# Patient Record
Sex: Male | Born: 1940
Health system: Southern US, Community
[De-identification: ages and names within clinical notes are randomized; demographics above are authoritative.]

## PROBLEM LIST (undated history)

## (undated) DIAGNOSIS — J449 Chronic obstructive pulmonary disease, unspecified: Secondary | ICD-10-CM

## (undated) DIAGNOSIS — I714 Abdominal aortic aneurysm, without rupture, unspecified: Secondary | ICD-10-CM

## (undated) DIAGNOSIS — I10 Essential (primary) hypertension: Secondary | ICD-10-CM

## (undated) DIAGNOSIS — M519 Unspecified thoracic, thoracolumbar and lumbosacral intervertebral disc disorder: Secondary | ICD-10-CM

## (undated) DIAGNOSIS — IMO0002 Reserved for concepts with insufficient information to code with codable children: Secondary | ICD-10-CM

## (undated) HISTORY — DX: Chronic obstructive pulmonary disease, unspecified: J44.9

## (undated) HISTORY — DX: Abdominal aortic aneurysm, without rupture: I71.4

## (undated) HISTORY — PX: HERNIA REPAIR: SHX51

## (undated) HISTORY — DX: Essential (primary) hypertension: I10

## (undated) HISTORY — DX: Abdominal aortic aneurysm, without rupture, unspecified: I71.40

## (undated) HISTORY — DX: Unspecified thoracic, thoracolumbar and lumbosacral intervertebral disc disorder: M51.9

## (undated) HISTORY — PX: HEMORROIDECTOMY: SUR656

## (undated) HISTORY — DX: Reserved for concepts with insufficient information to code with codable children: IMO0002

---

## 2011-01-24 ENCOUNTER — Ambulatory Visit
Admission: RE | Admit: 2011-01-24 | Discharge: 2011-01-24 | Payer: Self-pay | Source: Home / Self Care | Attending: Vascular Surgery | Admitting: Vascular Surgery

## 2011-01-24 ENCOUNTER — Ambulatory Visit: Admit: 2011-01-24 | Payer: Self-pay | Admitting: Vascular Surgery

## 2011-01-25 NOTE — Consult Note (Signed)
NEW PATIENT CONSULTATION  JOSHIA, KITCHINGS DOB:  [birth year]                                       01/24/2011 ZOXWR#:[id number]  I saw the patient in the office today in consultation concerning a 6.7- cm infrarenal abdominal aortic aneurysm.  He was referred by Dr. Sherryll Burger. This is a pleasant 70 year old gentleman who had a long history of back pain.  This gradually became worse when he was having to help his wife more, who was debilitated.  Unfortunately, she died in 01/19/11.  As a workup for his back pain, he underwent an MRI which shows degenerative disk disease at L1-2, L2-3, and L3-4.  Apparently, incidental finding was suggestive of abdominal aneurysm and subsequent CT scan of the abdomen was obtained which showed a 6.7-cm infrarenal abdominal aortic aneurysm which ended at the bifurcation.  He was sent for vascular consultation.  Of note, he has had no significant abdominal pain.  Of note, there is no family history of aneurysmal disease that he is aware of.  PAST MEDICAL HISTORY:  Significant for emphysema.  He denies any history of diabetes, hypertension, history of previous myocardial infarction, history of hypercholesterolemia, history of congestive heart failure.  PREVIOUS SURGERY:  Hernia on the right side and hemorrhoids.  SOCIAL HISTORY:  He is recently widowed.  He has no children.  He is retired.  He smokes a pack per day of cigarettes and has been smoking for 50 years.  FAMILY HISTORY:  There is no history of premature cardiovascular disease and no history of aneurysmal disease that he is aware of.  MEDICATIONS:  Tylenol with codeine every 6 hours as needed for his back pain and also Flexeril 10 mg b.i.d.  REVIEW OF SYSTEMS:  GENERAL:  He had no recent weight loss, weight gain, or problems with his appetite. CARDIOVASCULAR:  He had no chest pain, chest pressure, palpitations, or arrhythmias.  He has had no history of stroke, TIAs, or  amaurosis fugax. He has had no history of DVT or phlebitis. GI:  He has had problems with swallowing in the past and occasional constipation.  He has had no history of reflux. MUSCULOSKELETAL:  He does have a history of arthritis. NEUROLOGIC, HEMATOLOGIC, GU, ENT, PSYCHIATRIC, INTEGUMENTARY:  Review of systems is unremarkable as documented on the medical history form in his chart. PULMONARY:  He does have a history of emphysema.  He has had no recent productive cough, bronchitis, asthma, or wheezing.  PHYSICAL EXAMINATION:  This is a pleasant 70 year old gentleman who appears stated age.  Blood pressure is 147/88, heart rate is 80, respiratory rate is 22.  HEENT:  Unremarkable.  Lungs:  Clear bilaterally to auscultation without rales, rhonchi, or wheezing. Cardiovascular:  I do not detect any carotid bruits.  He has a regular rate and rhythm.  He has palpable femoral, popliteal, and posterior tibial pulses bilaterally.  He has no significant lower extremity swelling.  Abdomen:  Soft and nontender.  His aneurysm is palpable and nontender.  He has normal pitched bowel sounds.  Musculoskeletal:  He has no major deformities or cyanosis.  Neurologic:  He has no focal weakness or paresthesias.  Skin:  There are no ulcers or rashes.  I have reviewed his MRI which shows degenerative disk disease at multiple levels.  I have also reviewed his plain x-rays.  Unfortunately, his CT scan is  not on the disk that came with him and I am unable to pull it up on the computer in our office.  I am hoping I can it pull up from the hospital system.  This was done in [place].  The CT scan report, however, shows an infrarenal abdominal aortic aneurysm with a maximum diameter to 6.7 cm and considerable intraluminal clot.  The aneurysm terminates at the aortic bifurcation.  The renal arteries are patent and the celiac and SMA are patent.  I have also reviewed the records from Dr. Margaretmary Eddy office and it  appears that he has also had injection therapy for his back which he did not mention.  Given the size of the aneurysm, I think the risk of rupture is approximately 10% per year.  For this reason, I would recommend elective repair.  I will like to proceed with an arteriogram to evaluate for possible endovascular repair and I will also try to retrieve his CT scan at [facility].  In addition, he will need preoperative cardiac evaluation and I will have him see Dr. Elease Hashimoto preoperatively.  Once his workup is complete, we can discuss open versus endovascular repair of his aneurysm and proceed with elective repair which he is anxious to do. I have also discussed the importance of tobacco cessation and he understands that continued tobacco use increases the risk of aneurysm expansion and rupture, and also increases his perioperative risk.    Di Kindle. Edilia Bo, M.D. Electronically Signed  CSD/MEDQ  D:  01/24/2011  T:  01/25/2011  Job:  [id number]  cc:   Kirstie Peri, MD Vesta Mixer, M.D.

## 2011-01-29 ENCOUNTER — Ambulatory Visit (HOSPITAL_COMMUNITY)
Admission: RE | Admit: 2011-01-29 | Discharge: 2011-01-30 | Payer: Self-pay | Source: Home / Self Care | Attending: Vascular Surgery | Admitting: Vascular Surgery

## 2011-01-29 LAB — POCT I-STAT, CHEM 8
Creatinine, Ser: 1.2 mg/dL (ref 0.4–1.5)
HCT: 51 % (ref 39.0–52.0)
Hemoglobin: 17.3 g/dL — ABNORMAL HIGH (ref 13.0–17.0)
Sodium: 139 mEq/L (ref 135–145)
TCO2: 29 mmol/L (ref 0–100)

## 2011-01-30 LAB — CBC
MCH: 30 pg (ref 26.0–34.0)
MCHC: 33 g/dL (ref 30.0–36.0)
Platelets: 176 10*3/uL (ref 150–400)
RBC: 5.16 MIL/uL (ref 4.22–5.81)

## 2011-01-30 LAB — BASIC METABOLIC PANEL
Calcium: 9.2 mg/dL (ref 8.4–10.5)
Chloride: 103 mEq/L (ref 96–112)
Creatinine, Ser: 1.11 mg/dL (ref 0.4–1.5)
GFR calc Af Amer: >60 mL/min (ref >60)
Sodium: 140 mEq/L (ref 135–145)

## 2011-02-05 ENCOUNTER — Ambulatory Visit (INDEPENDENT_AMBULATORY_CARE_PROVIDER_SITE_OTHER): Payer: Self-pay | Admitting: Cardiovascular Disease

## 2011-02-05 DIAGNOSIS — I714 Abdominal aortic aneurysm, without rupture: Secondary | ICD-10-CM

## 2011-02-05 DIAGNOSIS — Z01810 Encounter for preprocedural cardiovascular examination: Secondary | ICD-10-CM

## 2011-02-07 NOTE — Op Note (Signed)
NAME:  Bobby Haney, HARTY NO.:  [id number]  MEDICAL RECORD NO.:  [id number]          PATIENT TYPE:  OIB  LOCATION:  2013                         FACILITY:  [facility]  PHYSICIAN:  Di Kindle. Edilia Bo, M.D.DATE OF BIRTH:  1941/04/03  DATE OF PROCEDURE:  01/29/2011 DATE OF DISCHARGE:                              OPERATIVE REPORT   PREOPERATIVE DIAGNOSIS:  A 6.7-cm infrarenal abdominal aortic aneurysm.  POSTOPERATIVE DIAGNOSIS:  A 6.7-cm infrarenal abdominal aortic aneurysm.  PROCEDURE: 1. Ultrasound-guided access to the right common femoral artery. 2. Aortogram with bilateral iliac arteriogram and bilateral lower     extremity runoff. 3. Perclose of the right common femoral artery.  SURGEON:  Di Kindle. Edilia Bo, MD  ANESTHESIA:  Local with sedation.  TECHNIQUE:  The patient was taken to the PV lab, received a milligram of Versed and 50 mcg of fentanyl.  He received additional doses for his chronic low back pain.  The groin was prepped and draped in the usual sterile fashion.  After the skin was infiltrated with 1% lidocaine under ultrasound guidance, the right common femoral artery was cannulated and a guidewire introduced into the infrarenal aorta under fluoroscopic control.  A 5-French sheath was introduced over the wire.  A pigtail catheter was positioned at the L1 vertebral body and flush aortogram obtained.  The catheter was then advanced and a lateral projection was obtained.  Catheter was then positioned above the aortic bifurcation, and iliac oblique projections were obtained.  Next, bilateral lower extremity runoff films were obtained.  At the completion of the procedure, the 5-French sheath was exchanged for a Perclose device.  This was advanced until the wire was at the level of the skin.  The wire was then removed.  Catheter advanced until there was good return and then the foot deployed and the catheter retracted.  The suture was then engaged;  however, the suture did not catch, therefore the catheter foot was retracted and the catheter retracted so that a guidewire could be reintroduced and a new Perclose device was placed.  Again, the catheter was advanced, the wire removed, and then when there was good return from the marker port, the foot was deployed, the catheter retracted, and the needle engaged, and at this time it worked effectively.  The foot was then retracted, the catheter retracted, and the sutures tied using the knot pusher.  There was good hemostasis.  Pressure was held for 10 minutes.  FINDINGS:  There are single renal arteries bilaterally with no significant renal artery stenosis identified.  There was significant tortuosity of the neck of the aneurysm.  The size of the aneurysm cannot be determined by this study because of the laminated thrombus.  There was not frank aneurysmal disease of the common iliac arteries, although they were slightly ectatic.  Bilateral hypogastric arteries, external iliac arteries, common femoral, deep femoral, and superficial femoral arteries are patent.  Popliteal arteries are patent.  There was three- vessel runoff bilaterally via the anterior tibial, posterior tibial, and peroneal arteries, although peroneal arteries are somewhat small.     Di Kindle. Edilia Bo, M.D.  CSD/MEDQ  D:  01/29/2011  T:  01/29/2011  Job:  [id number]  cc:   Kirstie Peri, MD Vesta Mixer, M.D.  Electronically Signed by Waverly Ferrari M.D. on 02/07/2011 02:08:15 PM

## 2011-02-08 ENCOUNTER — Ambulatory Visit: Payer: Self-pay | Admitting: Vascular Surgery

## 2011-02-15 ENCOUNTER — Telehealth (INDEPENDENT_AMBULATORY_CARE_PROVIDER_SITE_OTHER): Payer: Self-pay | Admitting: Radiology

## 2011-02-19 ENCOUNTER — Encounter: Payer: Self-pay | Admitting: Internal Medicine

## 2011-02-19 ENCOUNTER — Ambulatory Visit (HOSPITAL_COMMUNITY): Payer: Medicare Other | Attending: Internal Medicine

## 2011-02-19 DIAGNOSIS — I714 Abdominal aortic aneurysm, without rupture, unspecified: Secondary | ICD-10-CM | POA: Insufficient documentation

## 2011-02-19 DIAGNOSIS — R0602 Shortness of breath: Secondary | ICD-10-CM

## 2011-02-21 NOTE — Progress Notes (Signed)
Summary: Nuclear Pre-Procedure  Phone Note Outgoing Call Call back at [facility] Phone [phone]   Call placed by: Stanton Kidney, EMT-P,  February 15, 2011 1:01 PM Call placed to: Patient Action Taken: Phone Call Completed Reason for Call: Confirm/change Appt Summary of Call: Reviewed information on Myoview Information Sheet (see scanned document for further details).  Spoke with the patient. Stanton Kidney, EMT-P  February 15, 2011 1:01 PM      Nuclear Med Background Indications for Stress Test: Evaluation for Ischemia, Surgical Clearance  Indications Comments: AAA (6.7) Dr Edilia Bo  History: COPD

## 2011-02-27 NOTE — Assessment & Plan Note (Signed)
Summary: Cardiology Nuclear Testing  Nuclear Med Background Indications for Stress Test: Evaluation for Ischemia, Surgical Clearance  Indications Comments: Pending AAA repair by Dr. Waverly Ferrari  History: COPD, Emphysema  History Comments: No documented CAD.  Symptoms: DOE, Fatigue    Nuclear Pre-Procedure Cardiac Risk Factors: PVD Caffeine/Decaff Intake: none NPO After: none Lungs: Clear.  O2 Sat 98% on RA. IV 0.9% NS with Angio Cath: 20g     IV Site: R Antecubital IV Started by: Stanton Kidney, EMT-P Chest Size (in) 36     Height (in): 71 Weight (lb): 147 BMI: 20.58 Tech Comments: AAA 6.7 cm  Nuclear Med Study 1 or 2 day study:  1 day     Stress Test Type:  Eugenie Birks Reading MD:  Dietrich Pates, MD     Referring MD:  Kristeen Miss, MD Resting Radionuclide:  Technetium 20m Tetrofosmin     Resting Radionuclide Dose:  10.1 mCi  Stress Radionuclide:  Technetium 44m Tetrofosmin     Stress Radionuclide Dose:  33 mCi   Stress Protocol   Lexiscan: 0.4 mg   Stress Test Technologist:  [employer], CMA-N     Nuclear Technologist:  Domenic Polite, CNMT  Rest Procedure  Myocardial perfusion imaging was performed at rest 45 minutes following the intravenous administration of Technetium 15m Tetrofosmin.  Stress Procedure  The patient received IV Lexiscan 0.4 mg over 15-seconds.  Technetium 51m Tetrofosmin injected at 30-seconds.  There were no significant changes with infusion.  Quantitative spect images were obtained after a 45 minute delay.  QPS Raw Data Images:  Soft tissue (diaplhragm, bowel) underlieheart.  IMages were motion corrected. Stress Images:  Normal homogeneous uptake in all areas of the myocardium. Rest Images:  Normal homogeneous uptake in all areas of the myocardium. Subtraction (SDS):  No evidence of ischemia. Transient Ischemic Dilatation:  1.10  (Normal <1.22)  Lung/Heart Ratio:  .26  (Normal <0.45)  Quantitative Gated Spect Images QGS EDV:  66 ml QGS  ESV:  18 ml QGS EF:  72 %   Overall Impression  Exercise Capacity: Lexiscan with no exercise. BP Response: Normal blood pressure response. Clinical Symptoms: No chest pain ECG Impression: No significant ST segment change suggestive of ischemia. Overall Impression: Normal stress nuclear study.

## 2011-03-01 ENCOUNTER — Ambulatory Visit: Payer: Medicare Other | Admitting: Vascular Surgery

## 2011-03-07 ENCOUNTER — Ambulatory Visit (INDEPENDENT_AMBULATORY_CARE_PROVIDER_SITE_OTHER): Payer: Medicare Other | Admitting: Vascular Surgery

## 2011-03-07 DIAGNOSIS — I714 Abdominal aortic aneurysm, without rupture: Secondary | ICD-10-CM

## 2011-03-08 NOTE — H&P (Signed)
HISTORY AND PHYSICAL EXAMINATION  March 07, 2011  Re:  Bobby Haney, Bobby Haney                   DOB:  10-09-1941  REASON FOR ADMISSION:  A  6.7 cm infrarenal abdominal aortic aneurysm.  HISTORY:  This is a pleasant, 70 year old gentleman who I originally saw in consultation on 01/24/2011 with a 6.7 cm infrarenal abdominal aortic aneurysm.  He is referred by Dr. Sherryll Burger.  He had a long history of back pain.  This had been getting worse.  He underwent an MRI to workup his back pain and has significant degenerative  disk disease L1-L2, L2-L3, L3-L4.  An incidental finding was this large abdominal aortic aneurysm which ends at the bifurcation.  I saw him in consultation and we have arranged his workup.  I do not think his back pain is related to his aneurysm.  He has had no significant abdominal pain.  PAST MEDICAL HISTORY:  Significant for COPD with continued tobacco use. He denies any history of diabetes, hypertension, history of previous myocardial infarction, history of congestive heart failure or history of hypercholesterolemia.  PAST SURGICAL HISTORY:  He has had a hernia on the right side.  SOCIAL HISTORY:  He is widowed.  He recently lost his wife in January of 2012.  She had chronic kidney disease and multiple other medical issues. He has no children. He is retired.  He smokes a pack per day of cigarettes and has been  smoking for 50 years.  Recently he has tried to cut back to about a half a pack per day.  FAMILY HISTORY:  There is no history of premature cardiovascular disease and history of aneurysmal disease that he is aware of.  MEDICATIONS: 1. Tylenol with codeine  q.6 h. p.r.n. pain. 2. Flexeril 10 mg p.o. b.i.d. 3. Oxycodone for pain p.r.n.  ALLERGIES:  No known drug allergies.  REVIEW OF SYSTEMS:  GENERAL:  He has had no recent weight loss, weight gain or problems with his appetite. CARDIOVASCULAR:  He had no chest pain, chest pressure, palpitations  or arrhythmias.  He has ad no history of stroke, TIAs or amaurosis fugax. He had no history of DVT or phlebitis. GI:  He has had problems with swallowing in the past and occasional constipation.  He has had no history of reflux. MUSCULOSKELETAL:  He does have a history of arthritis. Neurologic, hematologic, GU, ENT, psychiatric, integumentary review of systems is unremarkable and is documented on the medical history form in his chart. PULMONARY:  He does have a history of emphysema.  He had no recent productive cough, bronchitis, asthma or wheezing.  PHYSICAL EXAMINATION:  This is a pleasant 70 year old gentleman who appears his stated age.  Temperature is 98.0, blood pressure 135/86, heart rate is 82.  HEENT:  Unremarkable.  Lungs:  Clear bilaterally to auscultation without rales, rhonchi or wheezing.  Cardiovascular:  I do not detect any carotid bruits.  He has a regular rate and rhythm.  He has palpable femoral, popliteal, posterior tibial pulses bilaterally. He has no significant lower extremity swelling.  Abdomen:  Soft, nontender.  His aneurysm is palpable and nontender.  He has normal pitched bowel sounds.  Musculoskeletal exam:  There are no major deformities or cyanosis.  Neurologic exam;  He has no focal weakness or paresthesias.  Skin:  There are no ulcers or rashes.  His MRI shows degenerative disk disease at multiple levels.  His CT scan shows an infrarenal abdominal  aortic aneurysm with a maximum diameter of 6.7 cm and considerable intraluminal clot.  The aneurysm terminates at the aortic bifurcation.  Renal arteries are patent.  I have performed an arteriogram  on him which shows significant tortuosity at the proximal neck which  makes him not a candidate for endovascular repair.  I think there is simply too much angulation to get a proximal seal.  He has undergone preoperative cardiac workup by Dr. Kristeen Miss and [phone] has been cleared for surgery.  He had a  Cardiolite study which showed normal stress nuclear test;  no significant ST-segment changes suggestive of ischemia.  I have reviewed the indications for open repair of his aneurysm.  He understands the risk for rupture without repair is probably 15% per year with an aneurysm of this size.  We have discussed the potential complications of surgery including but not limited to bleeding, wound healing problems, bleeding, MI, renal failure and other unpredictable medical problems.  All of his questions were answered and he is agreeable to proceed.  His surgery is scheduled for 03/20/2011.    Di Kindle. Edilia Bo, M.D. Electronically Signed  CSD/MEDQ  D:  03/07/2011  T:  03/08/2011  Job:  [id number]  cc:   Vesta Mixer, M.D. Kirstie Peri, MD

## 2011-03-16 ENCOUNTER — Other Ambulatory Visit: Payer: Self-pay | Admitting: Vascular Surgery

## 2011-03-16 ENCOUNTER — Encounter: Payer: Self-pay | Admitting: Cardiovascular Disease

## 2011-03-16 ENCOUNTER — Ambulatory Visit (INDEPENDENT_AMBULATORY_CARE_PROVIDER_SITE_OTHER): Payer: Medicare Other | Admitting: Cardiovascular Disease

## 2011-03-16 ENCOUNTER — Ambulatory Visit (HOSPITAL_COMMUNITY)
Admission: RE | Admit: 2011-03-16 | Discharge: 2011-03-16 | Disposition: A | Discharge status: Home / Self Care | Payer: Medicare Other | Source: Ambulatory Visit | Attending: Vascular Surgery | Admitting: Vascular Surgery

## 2011-03-16 ENCOUNTER — Encounter (HOSPITAL_COMMUNITY)
Admission: RE | Admit: 2011-03-16 | Discharge: 2011-03-16 | Disposition: A | Discharge status: Home / Self Care | Payer: Medicare Other | Source: Ambulatory Visit | Attending: Vascular Surgery | Admitting: Vascular Surgery

## 2011-03-16 DIAGNOSIS — I999 Unspecified disorder of circulatory system: Secondary | ICD-10-CM

## 2011-03-16 DIAGNOSIS — I714 Abdominal aortic aneurysm, without rupture, unspecified: Secondary | ICD-10-CM | POA: Insufficient documentation

## 2011-03-16 DIAGNOSIS — Z01818 Encounter for other preprocedural examination: Secondary | ICD-10-CM | POA: Insufficient documentation

## 2011-03-16 LAB — CBC
HCT: 43.9 % (ref 39.0–52.0)
Hemoglobin: 15.5 g/dL (ref 13.0–17.0)
MCH: 31 pg (ref 26.0–34.0)
MCV: 87.8 fL (ref 78.0–100.0)
RBC: 5 MIL/uL (ref 4.22–5.81)
WBC: 10.3 10*3/uL (ref 4.0–10.5)

## 2011-03-16 LAB — COMPREHENSIVE METABOLIC PANEL
AST: 19 U/L (ref 0–37)
Albumin: 3.8 g/dL (ref 3.5–5.2)
Alkaline Phosphatase: 63 U/L (ref 39–117)
BUN: 7 mg/dL (ref 6–23)
Chloride: 113 mEq/L — ABNORMAL HIGH (ref 96–112)
GFR calc Af Amer: >60 mL/min (ref >60)
Potassium: 4.5 mEq/L (ref 3.5–5.1)
Total Bilirubin: 0.5 mg/dL (ref 0.3–1.2)
Total Protein: 6.4 g/dL (ref 6.0–8.3)

## 2011-03-16 LAB — BLOOD GAS, ARTERIAL
Drawn by: 181601
FIO2: 0.21 %
pCO2 arterial: 33.4 mmHg — ABNORMAL LOW (ref 35.0–45.0)
pH, Arterial: 7.442 (ref 7.350–7.450)

## 2011-03-16 LAB — URINALYSIS, ROUTINE W REFLEX MICROSCOPIC
Bilirubin Urine: NEGATIVE
Glucose, UA: NEGATIVE mg/dL
Hgb urine dipstick: NEGATIVE
Ketones, ur: NEGATIVE mg/dL
Specific Gravity, Urine: 1.019 (ref 1.005–1.030)
pH: 5 (ref 5.0–8.0)

## 2011-03-16 LAB — APTT: aPTT: 26 seconds (ref 24–37)

## 2011-03-16 LAB — SURGICAL PCR SCREEN
MRSA, PCR: NEGATIVE
Staphylococcus aureus: NEGATIVE

## 2011-03-16 LAB — ABO/RH: ABO/RH(D): O POS

## 2011-03-16 LAB — PROTIME-INR: INR: 0.87 (ref 0.00–1.49)

## 2011-03-16 NOTE — Progress Notes (Signed)
Subjective:   Current Outpatient Prescriptions  Medication Sig Dispense Refill  . HYDROcodone-acetaminophen (VICODIN) 5-500 MG per tablet Take 1 tablet by mouth every 6 (six) hours as needed.        . senna (SENOKOT) 8.6 MG tablet Take 1 tablet by mouth 2 (two) times daily as needed.          Allergies  Allergen Reactions  . Influenza A     Patient Active Problem List  Diagnoses  . AAA (abdominal aortic aneurysm)    History  Smoking status  . Current Everyday Smoker -- 2.0 packs/day  . Types: Cigarettes  Smokeless tobacco  . Not on file    History  Alcohol Use No    Family History  Problem Relation Age of Onset  . Heart attack Neg Hx     Review of Systems: The patient denies any heat or cold intolerance.  No weight gain or weight loss.  The patient denies headaches or blurry vision.  There is no cough or sputum production.  The patient denies dizziness.  There is no hematuria or hematochezia.  The patient denies any muscle aches or arthritis.  The patient denies any rash.  The patient denies frequent falling or instability.  There is no history of depression or anxiety.  All other systems were reviewed and are negative.  Physical Exam:  The patient is alert and oriented x 3.  The mood and affect are normal. The HEENT exam reveals that the sclera are nonicteric.  The mucous membranes are moist.  The carotids are 2+ without bruits.  There is no thyromegaly.  There is no JVD.  The lungs are clear.  The chest wall is non tender.  The heart exam reveals a regular rate with a normal S1 and S2.  There are no murmurs, gallops, or rubs.  The PMI is not displaced.   Abdominal exam reveals good bowel sounds.  There is no guarding or rebound.  Has a palpablePulsatile midline mass.  Exam of the legs reveal no clubbing, cyanosis, or edema.  The legs are without rashes.  The distal pulses are intact.  Cranial nerves II - XII are intact.  Motor and sensory functions are intact.  The gait is  normal.  Assessment / Plan:

## 2011-03-16 NOTE — Progress Notes (Deleted)
Subjective:   No current outpatient prescriptions on file prior to visit.    Allergies  Allergen Reactions  . Influenza A     Patient Active Problem List  Diagnoses  . AAA (abdominal aortic aneurysm)    History  Smoking status  . Current Everyday Smoker -- 2.0 packs/day  . Types: Cigarettes  Smokeless tobacco  . Not on file    History  Alcohol Use No    Family History  Problem Relation Age of Onset  . Heart attack Neg Hx     Review of Systems: The patient denies any heat or cold intolerance.  No weight gain or weight loss.  The patient denies headaches or blurry vision.  There is no cough or sputum production.  The patient denies dizziness.  There is no hematuria or hematochezia.  The patient denies any muscle aches or arthritis.  The patient denies any rash.  The patient denies frequent falling or instability.  There is no history of depression or anxiety.  All other systems were reviewed and are negative.   Physical Exam:  Assessment / Plan:

## 2011-03-16 NOTE — Assessment & Plan Note (Signed)
The patient presents as a preoperative visit for his abdominal aortic aneurysm. He's had a normal Lexiscan Myoview study. He has a long history of cigarette smoking and Is at some increased risk for cardiovascular complications. He was left ventricular systolic function is normal so I would only put him at mildly to moderately increased risk.  We will follow him in the hospital. I'll see him in 2 months.

## 2011-03-20 ENCOUNTER — Other Ambulatory Visit: Payer: Self-pay | Admitting: Vascular Surgery

## 2011-03-20 ENCOUNTER — Inpatient Hospital Stay (HOSPITAL_COMMUNITY)
Admission: RE | Admit: 2011-03-20 | Discharge: 2011-03-25 | DRG: 238 | Disposition: A | Discharge status: Home / Self Care | Payer: Medicare Other | Source: Ambulatory Visit | Attending: Vascular Surgery | Admitting: Vascular Surgery

## 2011-03-20 ENCOUNTER — Inpatient Hospital Stay (HOSPITAL_COMMUNITY): Payer: Medicare Other

## 2011-03-20 DIAGNOSIS — I714 Abdominal aortic aneurysm, without rupture, unspecified: Principal | ICD-10-CM | POA: Diagnosis present

## 2011-03-20 DIAGNOSIS — J4489 Other specified chronic obstructive pulmonary disease: Secondary | ICD-10-CM | POA: Diagnosis present

## 2011-03-20 DIAGNOSIS — F17200 Nicotine dependence, unspecified, uncomplicated: Secondary | ICD-10-CM | POA: Diagnosis present

## 2011-03-20 DIAGNOSIS — J449 Chronic obstructive pulmonary disease, unspecified: Secondary | ICD-10-CM | POA: Diagnosis present

## 2011-03-20 HISTORY — PX: ABDOMINAL AORTIC ANEURYSM REPAIR: SUR1152

## 2011-03-20 LAB — PROTIME-INR
INR: 1.08 (ref 0.00–1.49)
Prothrombin Time: 14.2 seconds (ref 11.6–15.2)

## 2011-03-20 LAB — BASIC METABOLIC PANEL
Calcium: 7.7 mg/dL — ABNORMAL LOW (ref 8.4–10.5)
Creatinine, Ser: 0.84 mg/dL (ref 0.4–1.5)
GFR calc Af Amer: >60 mL/min (ref >60)
GFR calc non Af Amer: >60 mL/min (ref >60)
Sodium: 140 mEq/L (ref 135–145)

## 2011-03-20 LAB — CBC
MCH: 29.8 pg (ref 26.0–34.0)
MCHC: 33.2 g/dL (ref 30.0–36.0)
Platelets: 169 10*3/uL (ref 150–400)
RDW: 14.2 % (ref 11.5–15.5)

## 2011-03-20 LAB — MAGNESIUM: Magnesium: 1.6 mg/dL (ref 1.5–2.5)

## 2011-03-21 ENCOUNTER — Inpatient Hospital Stay (HOSPITAL_COMMUNITY): Payer: Medicare Other

## 2011-03-21 LAB — CBC
MCH: 30.6 pg (ref 26.0–34.0)
MCHC: 33.8 g/dL (ref 30.0–36.0)
MCV: 90.3 fL (ref 78.0–100.0)
Platelets: 156 10*3/uL (ref 150–400)
RBC: 4.45 MIL/uL (ref 4.22–5.81)
RDW: 14.2 % (ref 11.5–15.5)

## 2011-03-21 LAB — TYPE AND SCREEN
Antibody Screen: NEGATIVE
Unit division: 0

## 2011-03-21 LAB — COMPREHENSIVE METABOLIC PANEL
Alkaline Phosphatase: 45 U/L (ref 39–117)
BUN: 6 mg/dL (ref 6–23)
Creatinine, Ser: 0.72 mg/dL (ref 0.4–1.5)
Glucose, Bld: 134 mg/dL — ABNORMAL HIGH (ref 70–99)
Potassium: 4.1 mEq/L (ref 3.5–5.1)
Total Protein: 4.5 g/dL — ABNORMAL LOW (ref 6.0–8.3)

## 2011-03-21 NOTE — Op Note (Signed)
NAME:  Bobby Haney, Bobby Haney NO.:  [id number]  MEDICAL RECORD NO.:  [id number]           PATIENT TYPE:  LOCATION:                                 FACILITY:  PHYSICIAN:  Di Kindle. Edilia Bo, M.D.DATE OF BIRTH:  [birth year]  DATE OF PROCEDURE: DATE OF DISCHARGE:                              OPERATIVE REPORT   PREOPERATIVE DIAGNOSIS:  A 6.7 cm infrarenal abdominal aortic aneurysm.  POSTOPERATIVE DIAGNOSIS:  A 6.7 cm infrarenal abdominal aortic aneurysm.  PROCEDURE:  Repair of infrarenal abdominal aortic aneurysm with aortobi- iliac bypass graft.  SURGEON:  Di Kindle. Edilia Bo, M.D.  ASSISTANT:  Della Goo, PA-C.  ANESTHESIA:  General.  INDICATIONS:  This is a 70 year old gentleman who I had seen in consultation in January with a 6.7-cm infrarenal abdominal aortic aneurysm, which was found on an MRI to work up back pain.  Given the size of the aneurysm and risk for rupture, an elective repair was recommended.  He had significant tortuosity of the neck of the aneurysm and therefore was not felt to be a good candidate for endovascular repair of his aneurysm.  He was brought in for elective open repair.  He underwent preoperative cardiac clearance by Dr. Elease Hashimoto.  TECHNIQUE:  The patient was taken to the operating room after Swan-Ganz catheter and arterial line were placed by Anesthesia.  The abdomen, groins, and thighs were prepped and draped in the usual sterile fashion. The abdomen was entered through a midline incision.  Upon careful exploration, no other intra-abdominal pathology was noted except for the large infrarenal aneurysm.  The transverse colon was reflected superiorly.  The small bowel reflected to the right.  The retroperitoneal tissue was divided allowing exposure of the neck of the aneurysm up to the level of the renal vein, which was mobilized and controlled with a blue vessel loop.  I then dissected out the neck of the aneurysm and this  was controlled with an umbilical tape.  The IMA was controlled with vessel loop.  Dissection was carried down to where the right common iliac artery was controlled with a blue loop.  I then preserved the sympathetic nerves over the aortic bifurcation and dissected the left common iliac artery lateral to this.  The patient was then heparinized.  The infrarenal aortic clamp was then placed below the level of the renal arteries and then the common iliac arteries were clamped.  The aneurysm was opened and teed off proximally and distally. Lumbars were oversewn with 2-0 silk ties.  Once hemostasis was obtained, the proximal aorta was divided circumferentially and proximally.  The 14 x 8 graft was selected.  This was cut to the appropriate length and then using a felt cuff the proximal anastomosis was done end-to-end with continuous 3-0 Prolene suture.  The proximal anastomosis was tested and was hemostatic, next attention was turned to the right iliac anastomosis.  The right common iliac artery was divided circumferentially.  The right limb of the graft cut to the appropriate length, spatulated, and sewn end-to-end to the right common iliac artery using 5-0 Prolene suture.  Prior to completing the anastomosis,  the arteries were back bled and flushed appropriately and anastomosis completed.  Flow was reestablished to the right leg.  The patient tolerated this from a hemodynamic standpoint.  Next,  attention was turned to the left common iliac anastomosis.  The left limb of the graft was cut to the appropriate length, spatulated, and sewn end-to-side to the divided left common iliac artery using continuous 5-0 Prolene suture.  Again, prior to completing this anastomosis the arteries were back bled and flushed appropriately and then the anastomosis completed and flow was reestablished to the left leg, which the patient tolerated from a hemodynamic standpoint.  There had been good backbleeding  from the IMA and this was ligated with 2-0 silk ties.  Of note, the left limb of the graft lay on top of the sympathetic fibers as I thought tunneling beneath the sympathetic fibers with resulting kinking of the graft. Next, the heparin was partially reversed with protamine.  The abdominal contents were returned to the normal position after the abdomen was irrigated.  Hemostasis had been obtained.  The fascial layer was closed with two #1 PDS sutures.  The subcutaneous tissue over the umbilicus was closed with 3-0 Vicryl and then the skin was closed with two 4-0 subcuticular stitch.  Sterile dressing was applied.  The patient tolerated the procedure well, was transferred to the recovery room in stable condition.  All needle and sponge counts were correct.     Di Kindle. Edilia Bo, M.D.     CSD/MEDQ  D:  03/20/2011  T:  03/20/2011  Job:  [id number]  cc:   Kirstie Peri, MD Vesta Mixer, M.D.  Electronically Signed by Waverly Ferrari M.D. on 03/21/2011 11:17:11 AM

## 2011-03-22 LAB — CBC
MCH: 30.5 pg (ref 26.0–34.0)
MCHC: 33.5 g/dL (ref 30.0–36.0)
Platelets: 148 10*3/uL — ABNORMAL LOW (ref 150–400)

## 2011-03-22 LAB — BASIC METABOLIC PANEL
CO2: 31 mEq/L (ref 19–32)
Calcium: 8.4 mg/dL (ref 8.4–10.5)
Creatinine, Ser: 0.79 mg/dL (ref 0.4–1.5)
GFR calc Af Amer: >60 mL/min (ref >60)

## 2011-03-23 LAB — GLUCOSE, CAPILLARY: Glucose-Capillary: 87 mg/dL (ref 70–99)

## 2011-03-24 LAB — BASIC METABOLIC PANEL
BUN: 7 mg/dL (ref 6–23)
CO2: 28 mEq/L (ref 19–32)
Calcium: 8.6 mg/dL (ref 8.4–10.5)
Creatinine, Ser: 0.69 mg/dL (ref 0.4–1.5)
GFR calc Af Amer: >60 mL/min (ref >60)

## 2011-03-24 LAB — CBC
Hemoglobin: 11.9 g/dL — ABNORMAL LOW (ref 13.0–17.0)
MCH: 30.1 pg (ref 26.0–34.0)
MCHC: 33.5 g/dL (ref 30.0–36.0)

## 2011-03-25 IMAGING — CR DG CHEST 1V PORT
1 series · 1 of 1 positions shown · non-contrast
Comparison: 03/16/2011.

CLINICAL DATA: History of post abdominal aortic aneurysm repair and
grafting.

PORTABLE CHEST - 1 VIEW

[view not recorded]
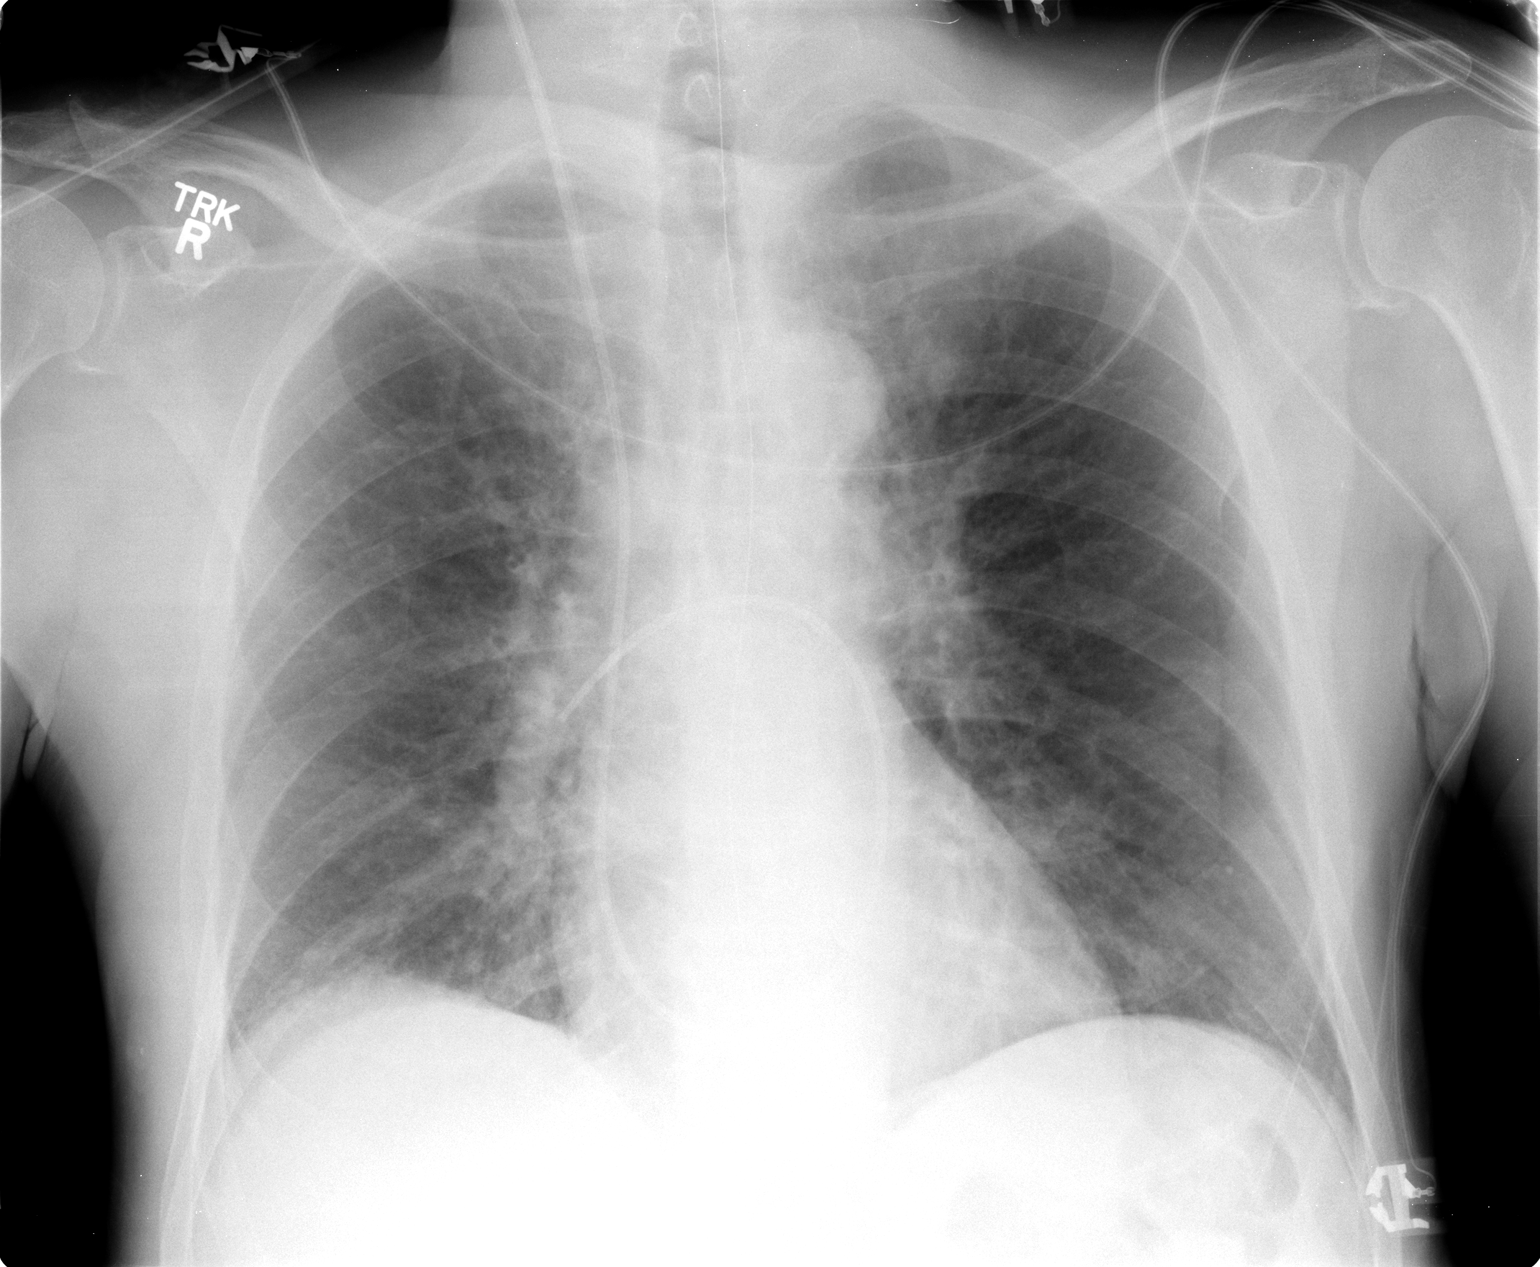

[1 of 1 positions shown; findings below may reference images not displayed]

FINDINGS: Enteric tube extends into the area of the stomach.  Tip
is not included on the image.  Right internal jugular Swan-Ganz
catheter is in place with tip in the area of descending right
pulmonary artery.  No pneumothorax is seen.  Heart is upper normal
size.  Lungs are free of infiltrates.  No pleural effusion is
evident.  Nonaneurysmal aortic calcification is present.
IMPRESSION: Enteric tube in place.  Tip of Swan Ganz catheter is in descending
right  pulmonary artery.  No pneumothorax.  No pulmonary edema,
pneumonia, or pleural effusion.

## 2011-03-26 IMAGING — CR DG CHEST 1V PORT
1 series · 1 of 1 positions shown · non-contrast
Comparison: 03/20/2011

CLINICAL DATA: Post aneurysm repair

PORTABLE CHEST - 1 VIEW

[view not recorded]
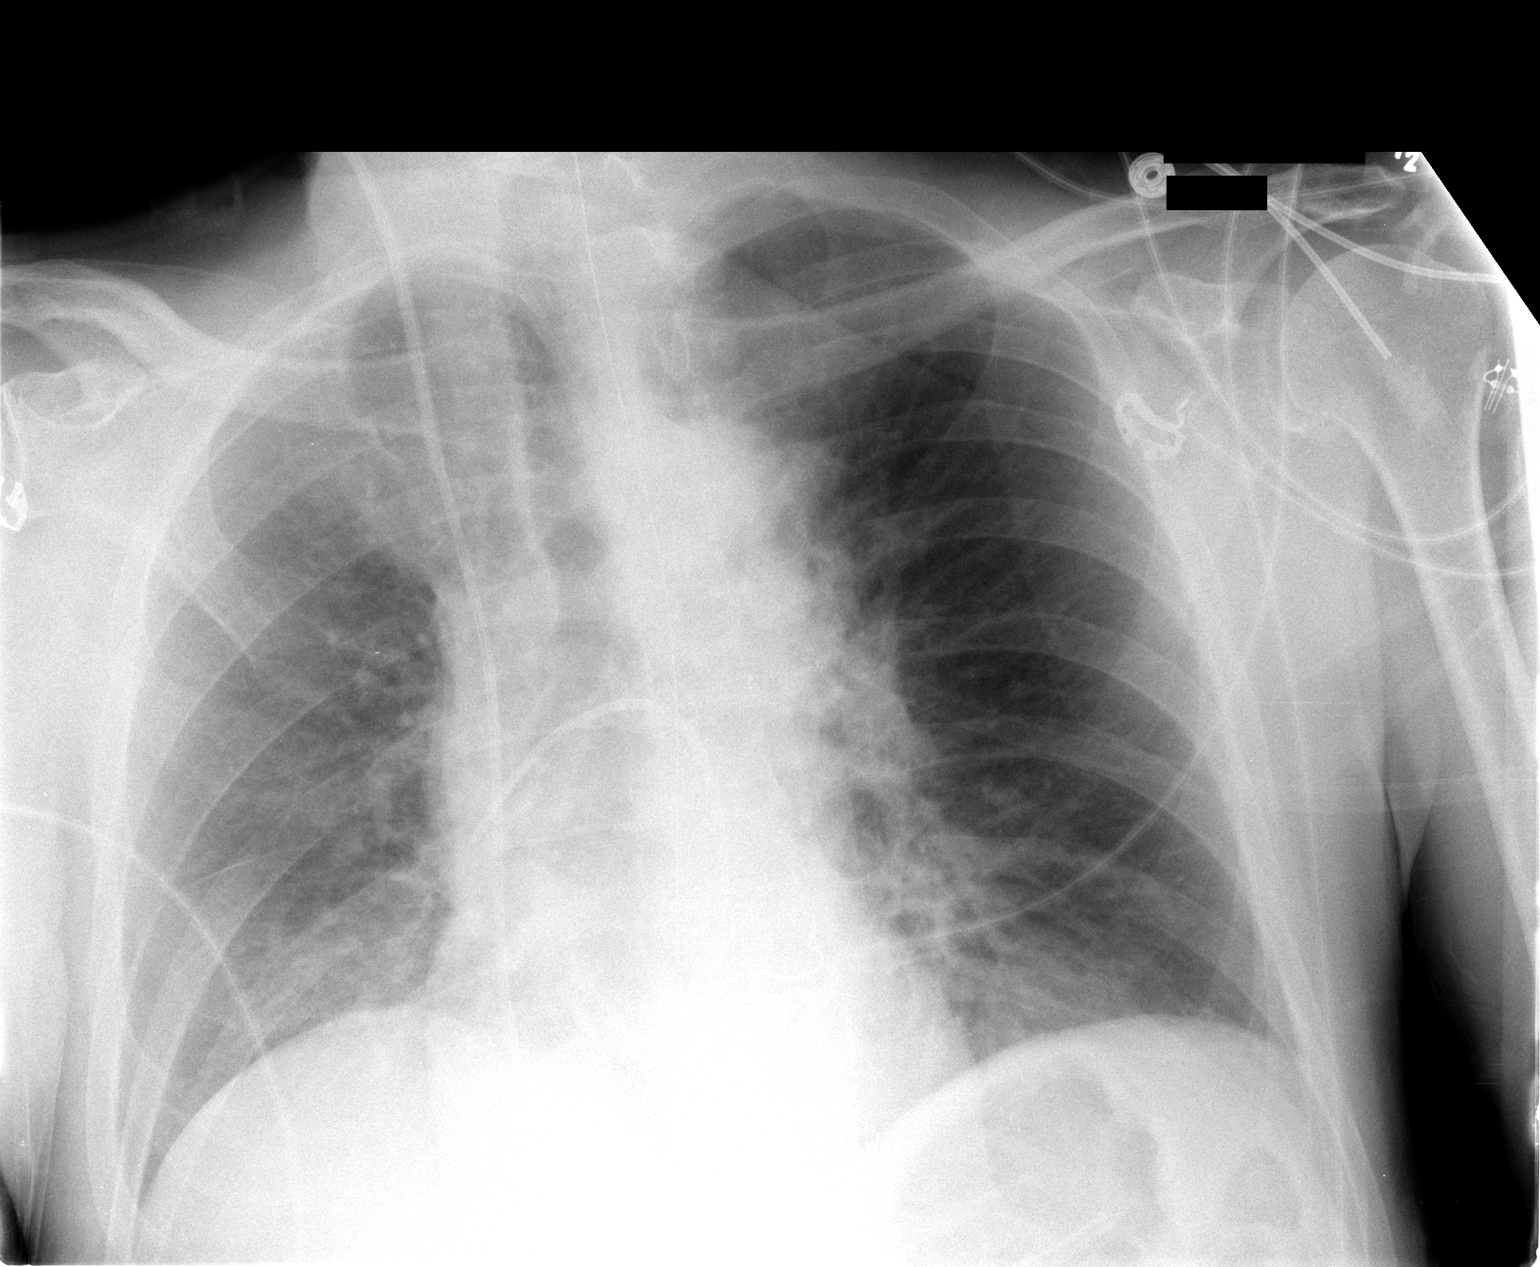

[1 of 1 positions shown; findings below may reference images not displayed]

FINDINGS: The patient is rotated to the right on today's exam.
There is minimal atelectasis at the lung bases.  Swan-Ganz catheter
and nasogastric tube unchanged.  No pneumothorax or heart failure.
IMPRESSION: Mild postoperative bibasilar atelectasis.

## 2011-03-27 NOTE — Discharge Summary (Addendum)
  NAME:  Bobby Haney, Bobby Haney NO.:  [id number]  MEDICAL RECORD NO.:  [id number]           PATIENT TYPE:  I  LOCATION:  2032                         FACILITY:  [facility]  PHYSICIAN:  Di Kindle. Edilia Bo, M.D.DATE OF BIRTH:  1941-10-24  DATE OF ADMISSION:  03/20/2011 DATE OF DISCHARGE:                              DISCHARGE SUMMARY   CHIEF COMPLAINT:  A 6.7-cm infrarenal abdominal aortic aneurysm.  HISTORY OF PRESENT ILLNESS:  Bobby Haney is a pleasant 71 year old gentleman who was seen in consultation in January 2012 with a 6.7 infrarenal abdominal aortic aneurysm which was found when the patient underwent an MRI for lumbar disk disease.  It was not felt that the back pain was related to his aneurysm and he has no significant abdominal pain.  He was admitted for open resection of abdominal aortic aneurysm as he had a long tortuous neck and a spherical aneurysm.  PAST MEDICAL HISTORY:  COPD and continued tobacco use.  He denies diabetes, hypertension, previous myocardial infarction, congestive heart failure, or hypercholesterolemia.  HOSPITAL COURSE:  The patient was taken to the operating room on March 20 for an open repair of infrarenal abdominal aortic aneurysm with aortobi-iliac bypass grafting.  Postoperatively, the patient did well. He had palpable pulses in bilateral lower extremities.  His wounds were healing well.  NG tube was discontinued.  He remained afebrile.  His vital signs were stable.  Hemoglobin and hematocrit were stable at 11.9 and 35.5 and his white count was 6.1.  His electrolytes were within normal limits.  His creatinine was 0.69.  He was ambulating, voiding, and taking a regular diet without difficulty after his NG tube was pulled on the second postoperative day and he will be discharged on March 25, 2011, to home.  FINAL DIAGNOSIS:  A 6.7-cm abdominal aortic aneurysm with an angulated neck, status post open repair with aortobiiliac bypass  graft.  His other chronic medical problems were stable while in-house.  DISPOSITION:  The patient will be discharged to home.  He will follow up with Dr. Edilia Bo in 2-3 weeks.  DISCHARGE MEDICATIONS: 1. Flexeril 10 mg twice daily as needed. 2. Toprol-XL 25 mg one-half tablet for a total of 12.5 mg by mouth     daily. 3. Hydrocodone 5/500 mg 1-2 tablets every 6 hours as needed for pain.     Della Goo, PA-C   ______________________________ Di Kindle. Edilia Bo, M.D.    RR/MEDQ  D:  03/24/2011  T:  03/25/2011  Job:  [id number]  Electronically Signed by Della Goo PA on 03/27/2011 11:23:38 AM Electronically Signed by Waverly Ferrari M.D. on 04/03/2011 07:41:29 AM

## 2011-04-18 ENCOUNTER — Encounter (INDEPENDENT_AMBULATORY_CARE_PROVIDER_SITE_OTHER): Payer: Medicare Other

## 2011-04-18 ENCOUNTER — Ambulatory Visit (INDEPENDENT_AMBULATORY_CARE_PROVIDER_SITE_OTHER): Payer: Medicare Other | Admitting: Vascular Surgery

## 2011-04-18 DIAGNOSIS — I714 Abdominal aortic aneurysm, without rupture, unspecified: Secondary | ICD-10-CM

## 2011-04-18 DIAGNOSIS — Z48812 Encounter for surgical aftercare following surgery on the circulatory system: Secondary | ICD-10-CM

## 2011-04-19 NOTE — Assessment & Plan Note (Signed)
OFFICE VISIT  Bobby Haney, Bobby Haney DOB:  02/22/1941                                       04/18/2011 ZOXWR#:[id number]  I saw the patient in the office today for follow-up after recent repair of his abdominal aortic aneurysm.  This is a pleasant 69 year old gentleman who I saw in consultation with a 6.7-cm infrarenal abdominal aortic aneurysm which was found on an MRI to work up his back pain.  He had significant tortuosity of the neck the aneurysm and therefore is not a candidate for endovascular repair.  He underwent preoperative cardiac evaluation by Dr. Elease Hashimoto.  On 03/20/2011 he underwent open repair of his infrarenal aneurysm with an aortobi-iliac graft.  He did well postoperatively and comes in for his first outpatient visit.  He has been gradually resuming his normal activities.  He has had no significant leg pain.  He actually states his back pain is improved significantly.  On examination, blood pressure is 129/86, temperature is 97.9, heart rate is 76.  Lungs are clear bilaterally to auscultation.  On cardiovascular exam, he has a regular rate and rhythm.  He has palpable femoral and pedal pulses.  Abdomen is soft and nontender.  His incision is healing nicely.  He had some slight inflammation of his inferior incision and had a small stitch, which I removed.  Arterial Doppler study today shows biphasic Doppler signals in both feet with an ABI of 100% bilaterally.  Overall I am pleased to progress.  I will plan on seeing him back in 6 months.  He knows to call sooner if he has problems.    Di Kindle. Edilia Bo, M.D. Electronically Signed  CSD/MEDQ  D:  04/18/2011  T:  04/19/2011  Job:  [id number]  cc:   Kirstie Peri, MD Vesta Mixer, M.D.

## 2011-05-14 ENCOUNTER — Encounter: Payer: Self-pay | Admitting: Cardiovascular Disease

## 2011-05-14 DIAGNOSIS — IMO0002 Reserved for concepts with insufficient information to code with codable children: Secondary | ICD-10-CM | POA: Insufficient documentation

## 2011-05-16 ENCOUNTER — Ambulatory Visit: Payer: Medicare Other | Admitting: Cardiovascular Disease

## 2011-05-17 ENCOUNTER — Telehealth: Payer: Self-pay | Admitting: *Deleted

## 2011-05-17 NOTE — Telephone Encounter (Signed)
Pt unaware missed app, dr said not to be seen for 6months, would call at later date to set up app in September 2012.Alfonso Ramus RN

## 2011-10-10 ENCOUNTER — Encounter: Payer: Self-pay | Admitting: Vascular Surgery

## 2011-10-17 ENCOUNTER — Ambulatory Visit: Payer: Medicare Other | Admitting: Vascular Surgery

## 2011-10-24 ENCOUNTER — Ambulatory Visit: Payer: Medicare Other | Admitting: Vascular Surgery

## 2011-11-20 ENCOUNTER — Encounter: Payer: Self-pay | Admitting: Vascular Surgery

## 2011-11-21 ENCOUNTER — Encounter: Payer: Self-pay | Admitting: Vascular Surgery

## 2011-11-21 ENCOUNTER — Ambulatory Visit (INDEPENDENT_AMBULATORY_CARE_PROVIDER_SITE_OTHER): Payer: Medicare Other | Admitting: Vascular Surgery

## 2011-11-21 ENCOUNTER — Ambulatory Visit (INDEPENDENT_AMBULATORY_CARE_PROVIDER_SITE_OTHER): Payer: Medicare Other | Admitting: *Deleted

## 2011-11-21 VITALS — BP 155/85 | HR 58 | Resp 16 | Ht 71.0 in | Wt 165.0 lb

## 2011-11-21 DIAGNOSIS — I714 Abdominal aortic aneurysm, without rupture, unspecified: Secondary | ICD-10-CM | POA: Insufficient documentation

## 2011-11-21 DIAGNOSIS — Z48812 Encounter for surgical aftercare following surgery on the circulatory system: Secondary | ICD-10-CM

## 2011-11-21 NOTE — Progress Notes (Signed)
[facility]  Patient name: Bobby Haney MRN: [id number] DOB: [birth year] Sex: male  CC: follow up after open abdominal aortic aneurysm repair.  HPI: Bobby Haney is a 70 y.o. male who underwent open repair of a 6.7 cm infrarenal abdominal aortic aneurysm on 03/20/2011. He was last seen in April of this year he comes in for a 6 month follow up visit. Since I saw him last he said no significant claudication, rest pain, or nonhealing ulcers. His had no history of stroke, TIAs, expressive or receptive aphasia, or amaurosis fugax.  He does have a history of hypertension and COPD both of which have been stable. He does continue to have low back pain. This pain is aggravated by activity and relieved with rest.  Past Medical History  Diagnosis Date  . COPD (chronic obstructive pulmonary disease)   . AAA (abdominal aortic aneurysm)   . Degenerative disc disease   . Lumbar disc disease   . Hypertension     Family History  Problem Relation Age of Onset  . Heart attack Neg Hx     SOCIAL HISTORY: History  Substance Use Topics  . Smoking status: Current Everyday Smoker -- 0.5 packs/day    Types: Cigarettes  . Smokeless tobacco: Not on file  . Alcohol Use: No    Allergies  Allergen Reactions  . Influenza A     Current Outpatient Prescriptions  Medication Sig Dispense Refill  . HYDROcodone-acetaminophen (VICODIN) 5-500 MG per tablet Take 1 tablet by mouth every 6 (six) hours as needed.        Marland Kitchen lisinopril (PRINIVIL,ZESTRIL) 10 MG tablet Take 10 mg by mouth daily.        . metoprolol succinate (TOPROL XL) 25 MG 24 hr tablet Take 12.5 mg by mouth daily.        . Ranitidine HCl (ZANTAC PO) Take by mouth as needed.        . senna (SENOKOT) 8.6 MG tablet Take 1 tablet by mouth 2 (two) times daily as needed.        . cyclobenzaprine (FLEXERIL) 10 MG tablet Take 10 mg by mouth as needed.          REVIEW OF SYSTEMS: Arly.Keller ] denotes positive finding; [  ] denotes  negative finding CARDIOVASCULAR:  [ ]  chest pain   [ ]  chest pressure   [ ]  palpitations   [ ]  orthopnea   Arly.Keller ] dyspnea on exertion   [ ]  claudication   [ ]  rest pain   [ ]  DVT   [ ]  phlebitis PULMONARY:   [ ]  productive cough   [ ]  asthma   [ ]  wheezing NEUROLOGIC:   [ ]  weakness  [ ]  paresthesias  [ ]  aphasia  [ ]  amaurosis  [ ]  dizziness [X]  occ dizziness HEMATOLOGIC:   [ ]  bleeding problems   [ ]  clotting disorders MUSCULOSKELETAL:  [ ]  joint pain   [ ]  joint swelling [ ]  leg swelling GASTROINTESTINAL: [ ]   blood in stool  [ ]   hematemesis GENITOURINARY:  [ ]   dysuria  [ ]   hematuria PSYCHIATRIC:  [ ]  history of major depression INTEGUMENTARY:  [ ]  rashes  [ ]  ulcers CONSTITUTIONAL:  [ ]  fever   [ ]  chills  PHYSICAL EXAM: Filed Vitals:   11/21/11 0941  BP: 155/85  Pulse: 58  Resp: 16  Height: 5\' 11"  (1.803 m)  Weight: 165 lb (74.844 kg)  SpO2: 97%  Body mass index is 23.01 kg/(m^2). GENERAL: The patient is a well-nourished male, in no acute distress. The vital signs are documented above. CARDIOVASCULAR: There is a regular rate and rhythm without significant murmur appreciated. I do not detect any carotid bruits. He has palpable femoral pulses. Both feet are warm and well-perfused with palpable dorsalis pedis pulses. PULMONARY: There is good air exchange bilaterally without wheezing or rales. ABDOMEN: Soft and non-tender with normal pitched bowel sounds. His incision is healed nicely. There is no evidence of hernia. MUSCULOSKELETAL: There are no major deformities or cyanosis. NEUROLOGIC: No focal weakness or paresthesias are detected. SKIN: There are no ulcers or rashes noted. PSYCHIATRIC: The patient has a normal affect.  DATA:  I independently interpreted his arterial Doppler study today which shows triphasic Doppler signals in both feet with ABIs of 100% bilaterally.  MEDICAL ISSUES: Overall pleased with his progress. We have again discussed the importance of tobacco  cessation. Currently he does not want referral to the [facility] tobacco cessation program. I've encouraged him to stay as active as possible. I've ordered a follow up Doppler study in 18 months and I'll see him back at that time. He knows to call sooner if he has problems. In addition I discussed the importance of receiving prophylactic antibiotics for any invasive procedures.  DICKSON,CHRISTOPHER S [facility] [place] Office: [phone]

## 2013-05-06 ENCOUNTER — Ambulatory Visit: Payer: Medicare Other | Admitting: Neurosurgery

## 2013-06-09 ENCOUNTER — Encounter: Payer: Self-pay | Admitting: Vascular Surgery

## 2013-06-10 ENCOUNTER — Encounter: Payer: Self-pay | Admitting: Vascular Surgery

## 2013-06-10 ENCOUNTER — Encounter (INDEPENDENT_AMBULATORY_CARE_PROVIDER_SITE_OTHER): Payer: Medicare Other | Admitting: *Deleted

## 2013-06-10 ENCOUNTER — Ambulatory Visit (INDEPENDENT_AMBULATORY_CARE_PROVIDER_SITE_OTHER): Payer: Medicare Other | Admitting: Vascular Surgery

## 2013-06-10 VITALS — BP 121/77 | HR 60 | Resp 16 | Ht 71.0 in | Wt 160.0 lb

## 2013-06-10 DIAGNOSIS — R202 Paresthesia of skin: Secondary | ICD-10-CM | POA: Insufficient documentation

## 2013-06-10 DIAGNOSIS — I714 Abdominal aortic aneurysm, without rupture: Secondary | ICD-10-CM

## 2013-06-10 DIAGNOSIS — R002 Palpitations: Secondary | ICD-10-CM | POA: Insufficient documentation

## 2013-06-10 DIAGNOSIS — Z48812 Encounter for surgical aftercare following surgery on the circulatory system: Secondary | ICD-10-CM

## 2013-06-10 DIAGNOSIS — R209 Unspecified disturbances of skin sensation: Secondary | ICD-10-CM

## 2013-06-10 NOTE — Progress Notes (Signed)
[facility] [place]  Established Abdominal Aortic Aneurysm  History of Present Illness  The patient is a 72 y.o. (18-Jan-1941) male who presents with chief complaint: follow up for AAA.  Previous studies demonstrate an AAA, measuring 6.7 cm pre-op.  The patient does not have back or abdominal pain.  The patient is  a smoker.  The patient's PMH, PSH, SH, FamHx, Med, Allergies and ROS are unchanged from 11/21/19 .  Physical Examination  Filed Vitals:   06/10/13 1145  BP: 121/77  Pulse: 60  Resp: 16  Height: 5\' 11"  (1.803 m)  Weight: 160 lb (72.576 kg)  SpO2: 97%   Body mass index is 22.33 kg/(m^2).  General: A&O x 3, WDWN Pulmonary: Sym exp, good air movt, CTAB, no rales, rhonchi, & wheezing  Cardiac: RRR, Nl S1, S2, no Murmurs, rubs or gallops  Vascular: Vessel Right Left  Radial Palpable Palpable  Ulnar Palpable Palpable  Brachial Palpable Palpable  Carotid Palpable, without bruit Palpable, without bruit  Aorta Not palpable N/A  Femoral Palpable Palpable  Popliteal Not palpable Not palpable  PT  Palpable  Palpable  DP  Palpable  Palpable   Gastrointestinal: soft, NTND, -G/R, - HSM, - masses, + AAA / Surg. Inc No pulsatile masses   Musculoskeletal: M/S 5/5 throughout, Extremities without ischemic changes.  Neurologic: Pain and light touch intact in extremities  except , Motor exam as listed above  Non-Invasive Vascular Imaging  06/10/2013 ABI's Right 1.01 Left 1.04 Medical Decision Making  The patient is a 72 y.o. male who presents with: Open AAA bi-iliac surgery.   Based on this patient's exam and diagnostic studies, he needs to follow up in 3 years for duplex studies of bi-lateral iliac vessels.   management including strict control of blood pressure, blood glucose, and lipid levels, antiplatelet agents, obtaining regular exercise, and cessation of smoking.  We gave him a prescription for Zocor 10 mg daily to start and asked him to follow  up with his primary care physician.  Thank you for allowing [place] to participate in this patient's care.  Thomasena Edis Shawnte Demarest [facility] PA-C [facility] [place] Office: [phone]   06/10/2013, 12:23 PM  Discharge medications: Statin use:  Yes ASA use:  Yes Plavix use:  No  for medical reason   Beta blocker use: Yes  Agree with above. He is doing well and I simply recommended a duplex scan in 3 months to look for metachronous aneurysms. I have instructed him to begin a statin given that there is a strong evidence that this will lower mortality in patients with vascular disease. Have also discussed the importance again of tobacco cessation. In addition he knows to seek prophylactic antibiotic as before any invasive procedures in order to lower his risk of graft infection.  Waverly Ferrari, MD, FACS Beeper [phone] 06/10/2013

## 2013-06-10 NOTE — Addendum Note (Signed)
Addended by: Adria Dill L on: 06/10/2013 03:31 PM   Modules accepted: Orders

## 2014-02-17 ENCOUNTER — Encounter: Payer: Self-pay | Admitting: Cardiovascular Disease

## 2016-06-07 ENCOUNTER — Encounter: Payer: Self-pay | Admitting: Vascular Surgery

## 2016-06-13 ENCOUNTER — Encounter: Payer: Self-pay | Admitting: Vascular Surgery

## 2016-06-13 ENCOUNTER — Ambulatory Visit (INDEPENDENT_AMBULATORY_CARE_PROVIDER_SITE_OTHER): Payer: Medicare Other | Admitting: Family

## 2016-06-13 ENCOUNTER — Other Ambulatory Visit: Payer: Self-pay | Admitting: Vascular Surgery

## 2016-06-13 ENCOUNTER — Ambulatory Visit (HOSPITAL_COMMUNITY)
Admission: RE | Admit: 2016-06-13 | Discharge: 2016-06-13 | Disposition: A | Discharge status: Home / Self Care | Payer: Medicare Other | Source: Ambulatory Visit | Attending: Vascular Surgery | Admitting: Vascular Surgery

## 2016-06-13 VITALS — BP 123/78 | HR 53 | Temp 97.3°F | Resp 18 | Ht 70.0 in | Wt 157.0 lb

## 2016-06-13 DIAGNOSIS — R002 Palpitations: Secondary | ICD-10-CM

## 2016-06-13 DIAGNOSIS — Z87891 Personal history of nicotine dependence: Secondary | ICD-10-CM

## 2016-06-13 DIAGNOSIS — I10 Essential (primary) hypertension: Secondary | ICD-10-CM | POA: Diagnosis not present

## 2016-06-13 DIAGNOSIS — I714 Abdominal aortic aneurysm, without rupture, unspecified: Secondary | ICD-10-CM

## 2016-06-13 DIAGNOSIS — R202 Paresthesia of skin: Secondary | ICD-10-CM

## 2016-06-13 DIAGNOSIS — I739 Peripheral vascular disease, unspecified: Secondary | ICD-10-CM

## 2016-06-13 DIAGNOSIS — Z9889 Other specified postprocedural states: Secondary | ICD-10-CM

## 2016-06-13 DIAGNOSIS — Z48812 Encounter for surgical aftercare following surgery on the circulatory system: Secondary | ICD-10-CM

## 2016-06-13 NOTE — Progress Notes (Deleted)
Vascular and Vein Specialist of [facility]  Patient name: Bobby Haney MRN: [id number] DOB: 13-Feb-1941 Sex: male  REASON FOR VISIT: Follow up after open repair of 6.7 cm infrarenal abdominal aortic aneurysm On 03/20/2011.  HPI: Bobby Haney is a 75 y.o. male Who underwent open repair of a 6.7 cm infrarenal abdominal aortic aneurysm on 03/20/2011. I last saw the patient on 06/10/2013. At that time, I recommended a duplex scan in 3 years to look for metachronous aneurysms.   Past Medical History  Diagnosis Date  . COPD (chronic obstructive pulmonary disease) (HCC)   . AAA (abdominal aortic aneurysm) (HCC)   . Degenerative disc disease   . Lumbar disc disease   . Hypertension     Family History  Problem Relation Age of Onset  . Heart attack Neg Hx     SOCIAL HISTORY: Social History  Substance Use Topics  . Smoking status: Former Smoker -- 0.50 packs/day    Types: Cigarettes    Quit date: 03/13/2016  . Smokeless tobacco: Never Used  . Alcohol Use: No    Allergies  Allergen Reactions  . Influenza A (H1n1) Monoval Vac     Current Outpatient Prescriptions  Medication Sig Dispense Refill  . amLODipine (NORVASC) 5 MG tablet Take 5 mg by mouth daily.    Marland Kitchen aspirin 81 MG tablet Take 81 mg by mouth daily.    . cyclobenzaprine (FLEXERIL) 10 MG tablet Take 10 mg by mouth as needed.      Marland Kitchen HYDROcodone-acetaminophen (VICODIN) 5-500 MG per tablet Take 0.5 tablets by mouth daily.     Marland Kitchen lisinopril (PRINIVIL,ZESTRIL) 10 MG tablet Take 20 mg by mouth 2 (two) times daily.     . metoprolol succinate (TOPROL XL) 25 MG 24 hr tablet Take 25 mg by mouth daily.     . Ranitidine HCl (ZANTAC PO) Take by mouth as needed.      . senna (SENOKOT) 8.6 MG tablet Take 1 tablet by mouth 2 (two) times daily as needed.       No current facility-administered medications for this visit.    REVIEW OF SYSTEMS:   denotes positive finding,  denotes negative finding Cardiac  Comments:  Chest pain or  chest pressure: ***   Shortness of breath upon exertion:    Short of breath when lying flat:    Irregular heart rhythm:        Vascular    Pain in calf, thigh, or hip brought on by ambulation:    Pain in feet at night that wakes you up from your sleep:     Blood clot in your veins:    Leg swelling:         Pulmonary    Oxygen at home:    Productive cough:     Wheezing:         Neurologic    Sudden weakness in arms or legs:     Sudden numbness in arms or legs:     Sudden onset of difficulty speaking or slurred speech:    Temporary loss of vision in one eye:     Problems with dizziness:         Gastrointestinal    Blood in stool:     Vomited blood:         Genitourinary    Burning when urinating:     Blood in urine:        Psychiatric    Major depression:  Hematologic    Bleeding problems:    Problems with blood clotting too easily:        Skin    Rashes or ulcers:        Constitutional    Fever or chills:      PHYSICAL EXAM: Filed Vitals:   06/13/16 0931  BP: 123/78  Pulse: 53  Temp: 97.3 F (36.3 C)  Resp: 18  Height: 5\' 10"  (1.778 m)  Weight: 157 lb (71.215 kg)  SpO2: 98%    GENERAL: The patient is a well-nourished male, in no acute distress. The vital signs are documented above. CARDIAC: There is a regular rate and rhythm.  VASCULAR: *** PULMONARY: There is good air exchange bilaterally without wheezing or rales. ABDOMEN: Soft and non-tender with normal pitched bowel sounds.  MUSCULOSKELETAL: There are no major deformities or cyanosis. NEUROLOGIC: No focal weakness or paresthesias are detected. SKIN: There are no ulcers or rashes noted. PSYCHIATRIC: The patient has a normal affect.  DATA:  ***  MEDICAL ISSUES: ***    Waverly Ferrariickson, Evart Mcdonnell [facility] Bee[facility] [phone]

## 2016-06-13 NOTE — Progress Notes (Signed)
[facility] [place]   CC: Follow up s/p open AAA bi-iliac repair  History of Present Illness  Bobby Haney is a 75 y.o. (01/20/1941) male patient of Dr. Edilia Bo who is s/p Open AAA bi-iliac surgery on 03/20/11.  He returns today for follow up.  Previous studies demonstrate an AAA, measuring 6.7 cm pre-op. The patient does not have abdominal pain, he does have chronic back pain, no new back pain, states he has known "slipped disc and arthritis" in his lower back takes hydrocodone for this. The patient is a smoker. Dr. Edilia Bo last saw pt on 06/10/13. At that time pt had normal ABI's. Pt was to follow up in 3 years with duplex studies of bi-lateral iliac.  The patient denies claudication in legs with walking. The patient denies history of stroke or TIA symptoms.  Pt Diabetic: No Pt smoker: former smoker, quit in April 2017, started about age 75   Past Medical History  Diagnosis Date  . COPD (chronic obstructive pulmonary disease) (HCC)   . AAA (abdominal aortic aneurysm) (HCC)   . Degenerative disc disease   . Lumbar disc disease   . Hypertension    Past Surgical History  Procedure Laterality Date  . Hernia repair    . Hemorroidectomy    . Abdominal aortic aneurysm repair  03/20/11   Social History Social History   Social History  . Marital Status: Widowed    Spouse Name: N/A  . Number of Children: N/A  . Years of Education: N/A   Occupational History  . Not on file.   Social History Main Topics  . Smoking status: Former Smoker -- 0.50 packs/day    Types: Cigarettes    Quit date: 03/13/2016  . Smokeless tobacco: Never Used  . Alcohol Use: No  . Drug Use: No  . Sexual Activity: Not on file   Other Topics Concern  . Not on file   Social History Narrative   Family History Family History  Problem Relation Age of Onset  . Heart attack Neg Hx     Current Outpatient Prescriptions on File Prior to Visit  Medication Sig Dispense Refill  .  amLODipine (NORVASC) 5 MG tablet Take 5 mg by mouth daily.    Marland Kitchen aspirin 81 MG tablet Take 81 mg by mouth daily.    . cyclobenzaprine (FLEXERIL) 10 MG tablet Take 10 mg by mouth as needed.      Marland Kitchen HYDROcodone-acetaminophen (VICODIN) 5-500 MG per tablet Take 0.5 tablets by mouth daily.     Marland Kitchen lisinopril (PRINIVIL,ZESTRIL) 10 MG tablet Take 20 mg by mouth 2 (two) times daily.     . metoprolol succinate (TOPROL XL) 25 MG 24 hr tablet Take 25 mg by mouth daily.     . Ranitidine HCl (ZANTAC PO) Take by mouth as needed.      . senna (SENOKOT) 8.6 MG tablet Take 1 tablet by mouth 2 (two) times daily as needed.       No current facility-administered medications on file prior to visit.   Allergies  Allergen Reactions  . Influenza A (H1n1) Monoval Vac     ROS: See HPI for pertinent positives and negatives.  Physical Examination  Filed Vitals:   06/13/16 0931  BP: 123/78  Pulse: 53  Temp: 97.3 F (36.3 C)  Resp: 18  Height:  (1.778 m)  Weight: 157 lb (71.215 kg)  SpO2: 98%   Body mass index is 22.53 kg/(m^2).  General: A&O x 3,  WDWN Pulmonary: Sym exp, good air movt, CTAB, non labored respirations.  Cardiac: RRR, Nl S1, S2, no detected murmur  Vascular: Vessel Right Left  Radial Palpable Palpable  Carotid Palpable, without bruit Palpable, without bruit  Aorta Not palpable N/A  Femoral Palpable Palpable  Popliteal Not palpable Not palpable  PT Palpable Palpable  DP Palpable Palpable   Gastrointestinal: soft, NTND, -G/R, - HSM, - palpable masses, + AAA well healed incision. No pulsatile masses  Musculoskeletal: M/S 5/5 throughout, Extremities without ischemic changes.  Neurologic: Pain and light touch intact in extremities except , Motor exam as listed above        Non-Invasive Vascular Imaging  AAA Duplex (06/13/2016) [facility] - ILIAC DUPLEX EVALUATION    INDICATION: Iliac evaluation    PREVIOUS INTERVENTION(S): Open AAA bi-iliac surgery  03/20/2011    DUPLEX EXAM:      Peak Systolic Velocity (cm/s)  AORTA - Proximal   AORTA - Mid   AORTA - Distal 54    RIGHT  LEFT  Peak Systolic Velocity (cm/s) Ratio (if abnormal) Waveform  Peak Systolic Velocity (cm/s) Ratio (if abnormal) Waveform  56  T Common Iliac Artery - Proximal 79  T  NV   Common Iliac Artery - Mid 84  T  79  T Common Iliac Artery - Distal 76  T  74  T External Iliac Artery - Proximal 145  T  75  T External Iliac Artery - Mid 86  T  126  T  External Iliac Artery - Distal 112  T      Internal Iliac Artery      Today's ABI / TBI   1.09 Previous ABI / TBI (06/10/2013  ) 1.04    Waveform:    M - Monophasic       B - Biphasic       T - Triphasic  If Ankle Brachial Index (ABI) or Toe Brachial Index (TBI) performed, please see complete report     ADDITIONAL FINDINGS: Limited visualization of the abdominal vasculature due to overlying bowel gas.    IMPRESSION: Widely patent AAA bi-iliac graft without evidence of stenosis.    Compared to the previous exam:  This is the first post-operative Duplex exam.      Medical Decision Making  The patient is a 75 y.o. male who is s/p who is s/p Open AAA bi-iliac surgery on 03/20/11.  He has no abdominal pain, has chronic back pain, no new back pain. He has no claudication sx's with walking, his pedal pulses are palpable. Today's aortoiliac duplex suggests widely patent AAA bi-iliac graft without evidence of stenosis.   Based on this patient's exam and diagnostic studies, the patient will follow up in 2 years  with the following studies: ABI's.    I emphasized the importance of maximal medical management including strict control of blood pressure, blood glucose, and lipid levels, antiplatelet agents, obtaining regular exercise, and continued  cessation of smoking.    Thank you for allowing us to participate in this patient's care.  Charisse MarchSuzanne Nickel, RN, MSN, FNP-C [facility] [place] Office:  [phone]  Clinic Physician: Edilia BoDickson  06/13/2016, 9:34 AM

## 2017-01-25 DIAGNOSIS — Z6825 Body mass index (BMI) 25.0-25.9, adult: Secondary | ICD-10-CM | POA: Diagnosis not present

## 2017-01-25 DIAGNOSIS — I714 Abdominal aortic aneurysm, without rupture: Secondary | ICD-10-CM | POA: Diagnosis not present

## 2017-01-25 DIAGNOSIS — Z299 Encounter for prophylactic measures, unspecified: Secondary | ICD-10-CM | POA: Diagnosis not present

## 2017-01-25 DIAGNOSIS — M549 Dorsalgia, unspecified: Secondary | ICD-10-CM | POA: Diagnosis not present

## 2017-01-25 DIAGNOSIS — I10 Essential (primary) hypertension: Secondary | ICD-10-CM | POA: Diagnosis not present

## 2017-01-25 DIAGNOSIS — Z79899 Other long term (current) drug therapy: Secondary | ICD-10-CM | POA: Diagnosis not present

## 2017-02-14 DIAGNOSIS — I10 Essential (primary) hypertension: Secondary | ICD-10-CM | POA: Diagnosis not present

## 2017-02-14 DIAGNOSIS — J449 Chronic obstructive pulmonary disease, unspecified: Secondary | ICD-10-CM | POA: Diagnosis not present

## 2017-02-25 DIAGNOSIS — Z713 Dietary counseling and surveillance: Secondary | ICD-10-CM | POA: Diagnosis not present

## 2017-02-25 DIAGNOSIS — R42 Dizziness and giddiness: Secondary | ICD-10-CM | POA: Diagnosis not present

## 2017-02-25 DIAGNOSIS — J449 Chronic obstructive pulmonary disease, unspecified: Secondary | ICD-10-CM | POA: Diagnosis not present

## 2017-02-25 DIAGNOSIS — E7800 Pure hypercholesterolemia, unspecified: Secondary | ICD-10-CM | POA: Diagnosis not present

## 2017-02-25 DIAGNOSIS — Z87891 Personal history of nicotine dependence: Secondary | ICD-10-CM | POA: Diagnosis not present

## 2017-02-25 DIAGNOSIS — Z6825 Body mass index (BMI) 25.0-25.9, adult: Secondary | ICD-10-CM | POA: Diagnosis not present

## 2017-02-25 DIAGNOSIS — Z299 Encounter for prophylactic measures, unspecified: Secondary | ICD-10-CM | POA: Diagnosis not present

## 2017-02-25 DIAGNOSIS — M549 Dorsalgia, unspecified: Secondary | ICD-10-CM | POA: Diagnosis not present

## 2017-02-25 DIAGNOSIS — I10 Essential (primary) hypertension: Secondary | ICD-10-CM | POA: Diagnosis not present

## 2017-03-14 DIAGNOSIS — J449 Chronic obstructive pulmonary disease, unspecified: Secondary | ICD-10-CM | POA: Diagnosis not present

## 2017-03-14 DIAGNOSIS — I10 Essential (primary) hypertension: Secondary | ICD-10-CM | POA: Diagnosis not present

## 2017-04-12 DIAGNOSIS — J449 Chronic obstructive pulmonary disease, unspecified: Secondary | ICD-10-CM | POA: Diagnosis not present

## 2017-04-12 DIAGNOSIS — I10 Essential (primary) hypertension: Secondary | ICD-10-CM | POA: Diagnosis not present

## 2017-04-24 DIAGNOSIS — Z79891 Long term (current) use of opiate analgesic: Secondary | ICD-10-CM | POA: Diagnosis not present

## 2017-04-24 DIAGNOSIS — H6122 Impacted cerumen, left ear: Secondary | ICD-10-CM | POA: Diagnosis not present

## 2017-04-24 DIAGNOSIS — R292 Abnormal reflex: Secondary | ICD-10-CM | POA: Diagnosis not present

## 2017-04-24 DIAGNOSIS — Z6824 Body mass index (BMI) 24.0-24.9, adult: Secondary | ICD-10-CM | POA: Diagnosis not present

## 2017-04-24 DIAGNOSIS — I10 Essential (primary) hypertension: Secondary | ICD-10-CM | POA: Diagnosis not present

## 2017-04-24 DIAGNOSIS — R001 Bradycardia, unspecified: Secondary | ICD-10-CM | POA: Diagnosis not present

## 2017-04-24 DIAGNOSIS — K08409 Partial loss of teeth, unspecified cause, unspecified class: Secondary | ICD-10-CM | POA: Diagnosis not present

## 2017-04-24 DIAGNOSIS — M545 Low back pain: Secondary | ICD-10-CM | POA: Diagnosis not present

## 2017-04-24 DIAGNOSIS — Z7982 Long term (current) use of aspirin: Secondary | ICD-10-CM | POA: Diagnosis not present

## 2017-04-24 DIAGNOSIS — Z87891 Personal history of nicotine dependence: Secondary | ICD-10-CM | POA: Diagnosis not present

## 2017-04-24 DIAGNOSIS — R0602 Shortness of breath: Secondary | ICD-10-CM | POA: Diagnosis not present

## 2017-04-24 DIAGNOSIS — E785 Hyperlipidemia, unspecified: Secondary | ICD-10-CM | POA: Diagnosis not present

## 2017-04-24 DIAGNOSIS — Z79899 Other long term (current) drug therapy: Secondary | ICD-10-CM | POA: Diagnosis not present

## 2017-04-24 DIAGNOSIS — Z0000 Encounter for general adult medical examination without abnormal findings: Secondary | ICD-10-CM | POA: Diagnosis not present

## 2017-04-24 DIAGNOSIS — H8110 Benign paroxysmal vertigo, unspecified ear: Secondary | ICD-10-CM | POA: Diagnosis not present

## 2017-04-24 DIAGNOSIS — K219 Gastro-esophageal reflux disease without esophagitis: Secondary | ICD-10-CM | POA: Diagnosis not present

## 2017-05-17 DIAGNOSIS — J449 Chronic obstructive pulmonary disease, unspecified: Secondary | ICD-10-CM | POA: Diagnosis not present

## 2017-05-17 DIAGNOSIS — I10 Essential (primary) hypertension: Secondary | ICD-10-CM | POA: Diagnosis not present

## 2017-05-30 DIAGNOSIS — I714 Abdominal aortic aneurysm, without rupture: Secondary | ICD-10-CM | POA: Diagnosis not present

## 2017-05-30 DIAGNOSIS — Z299 Encounter for prophylactic measures, unspecified: Secondary | ICD-10-CM | POA: Diagnosis not present

## 2017-05-30 DIAGNOSIS — Z713 Dietary counseling and surveillance: Secondary | ICD-10-CM | POA: Diagnosis not present

## 2017-05-30 DIAGNOSIS — R69 Illness, unspecified: Secondary | ICD-10-CM | POA: Diagnosis not present

## 2017-05-30 DIAGNOSIS — J449 Chronic obstructive pulmonary disease, unspecified: Secondary | ICD-10-CM | POA: Diagnosis not present

## 2017-05-30 DIAGNOSIS — R42 Dizziness and giddiness: Secondary | ICD-10-CM | POA: Diagnosis not present

## 2017-05-30 DIAGNOSIS — I10 Essential (primary) hypertension: Secondary | ICD-10-CM | POA: Diagnosis not present

## 2017-05-30 DIAGNOSIS — M549 Dorsalgia, unspecified: Secondary | ICD-10-CM | POA: Diagnosis not present

## 2017-05-30 DIAGNOSIS — E7800 Pure hypercholesterolemia, unspecified: Secondary | ICD-10-CM | POA: Diagnosis not present

## 2017-05-30 DIAGNOSIS — Z6825 Body mass index (BMI) 25.0-25.9, adult: Secondary | ICD-10-CM | POA: Diagnosis not present

## 2017-06-18 DIAGNOSIS — J449 Chronic obstructive pulmonary disease, unspecified: Secondary | ICD-10-CM | POA: Diagnosis not present

## 2017-06-18 DIAGNOSIS — I10 Essential (primary) hypertension: Secondary | ICD-10-CM | POA: Diagnosis not present

## 2017-07-02 DIAGNOSIS — J449 Chronic obstructive pulmonary disease, unspecified: Secondary | ICD-10-CM | POA: Diagnosis not present

## 2017-07-02 DIAGNOSIS — R69 Illness, unspecified: Secondary | ICD-10-CM | POA: Diagnosis not present

## 2017-07-02 DIAGNOSIS — I10 Essential (primary) hypertension: Secondary | ICD-10-CM | POA: Diagnosis not present

## 2017-07-02 DIAGNOSIS — E7800 Pure hypercholesterolemia, unspecified: Secondary | ICD-10-CM | POA: Diagnosis not present

## 2017-07-02 DIAGNOSIS — I714 Abdominal aortic aneurysm, without rupture: Secondary | ICD-10-CM | POA: Diagnosis not present

## 2017-07-02 DIAGNOSIS — Z6825 Body mass index (BMI) 25.0-25.9, adult: Secondary | ICD-10-CM | POA: Diagnosis not present

## 2017-07-02 DIAGNOSIS — Z713 Dietary counseling and surveillance: Secondary | ICD-10-CM | POA: Diagnosis not present

## 2017-07-02 DIAGNOSIS — Z299 Encounter for prophylactic measures, unspecified: Secondary | ICD-10-CM | POA: Diagnosis not present

## 2017-08-22 DIAGNOSIS — Z0000 Encounter for general adult medical examination without abnormal findings: Secondary | ICD-10-CM | POA: Diagnosis not present

## 2017-08-22 DIAGNOSIS — I10 Essential (primary) hypertension: Secondary | ICD-10-CM | POA: Diagnosis not present

## 2017-08-22 DIAGNOSIS — Z7189 Other specified counseling: Secondary | ICD-10-CM | POA: Diagnosis not present

## 2017-08-22 DIAGNOSIS — J449 Chronic obstructive pulmonary disease, unspecified: Secondary | ICD-10-CM | POA: Diagnosis not present

## 2017-08-22 DIAGNOSIS — Z6824 Body mass index (BMI) 24.0-24.9, adult: Secondary | ICD-10-CM | POA: Diagnosis not present

## 2017-08-22 DIAGNOSIS — Z79899 Other long term (current) drug therapy: Secondary | ICD-10-CM | POA: Diagnosis not present

## 2017-08-22 DIAGNOSIS — Z1211 Encounter for screening for malignant neoplasm of colon: Secondary | ICD-10-CM | POA: Diagnosis not present

## 2017-08-22 DIAGNOSIS — Z125 Encounter for screening for malignant neoplasm of prostate: Secondary | ICD-10-CM | POA: Diagnosis not present

## 2017-08-22 DIAGNOSIS — R5383 Other fatigue: Secondary | ICD-10-CM | POA: Diagnosis not present

## 2017-08-22 DIAGNOSIS — E7800 Pure hypercholesterolemia, unspecified: Secondary | ICD-10-CM | POA: Diagnosis not present

## 2017-08-22 DIAGNOSIS — Z299 Encounter for prophylactic measures, unspecified: Secondary | ICD-10-CM | POA: Diagnosis not present

## 2017-08-22 DIAGNOSIS — I714 Abdominal aortic aneurysm, without rupture: Secondary | ICD-10-CM | POA: Diagnosis not present

## 2017-08-22 DIAGNOSIS — Z1389 Encounter for screening for other disorder: Secondary | ICD-10-CM | POA: Diagnosis not present

## 2017-09-05 DIAGNOSIS — J449 Chronic obstructive pulmonary disease, unspecified: Secondary | ICD-10-CM | POA: Diagnosis not present

## 2017-09-05 DIAGNOSIS — I10 Essential (primary) hypertension: Secondary | ICD-10-CM | POA: Diagnosis not present

## 2017-09-26 DIAGNOSIS — R69 Illness, unspecified: Secondary | ICD-10-CM | POA: Diagnosis not present

## 2017-10-14 DIAGNOSIS — I10 Essential (primary) hypertension: Secondary | ICD-10-CM | POA: Diagnosis not present

## 2017-10-14 DIAGNOSIS — J449 Chronic obstructive pulmonary disease, unspecified: Secondary | ICD-10-CM | POA: Diagnosis not present

## 2017-11-12 DIAGNOSIS — I10 Essential (primary) hypertension: Secondary | ICD-10-CM | POA: Diagnosis not present

## 2017-11-12 DIAGNOSIS — J449 Chronic obstructive pulmonary disease, unspecified: Secondary | ICD-10-CM | POA: Diagnosis not present

## 2017-11-22 DIAGNOSIS — M549 Dorsalgia, unspecified: Secondary | ICD-10-CM | POA: Diagnosis not present

## 2017-11-22 DIAGNOSIS — I10 Essential (primary) hypertension: Secondary | ICD-10-CM | POA: Diagnosis not present

## 2017-11-22 DIAGNOSIS — E7800 Pure hypercholesterolemia, unspecified: Secondary | ICD-10-CM | POA: Diagnosis not present

## 2017-11-22 DIAGNOSIS — Z6824 Body mass index (BMI) 24.0-24.9, adult: Secondary | ICD-10-CM | POA: Diagnosis not present

## 2017-11-22 DIAGNOSIS — J449 Chronic obstructive pulmonary disease, unspecified: Secondary | ICD-10-CM | POA: Diagnosis not present

## 2017-11-22 DIAGNOSIS — Z299 Encounter for prophylactic measures, unspecified: Secondary | ICD-10-CM | POA: Diagnosis not present

## 2018-01-01 DIAGNOSIS — R69 Illness, unspecified: Secondary | ICD-10-CM | POA: Diagnosis not present

## 2018-01-01 DIAGNOSIS — Z299 Encounter for prophylactic measures, unspecified: Secondary | ICD-10-CM | POA: Diagnosis not present

## 2018-01-01 DIAGNOSIS — I10 Essential (primary) hypertension: Secondary | ICD-10-CM | POA: Diagnosis not present

## 2018-01-01 DIAGNOSIS — Z6824 Body mass index (BMI) 24.0-24.9, adult: Secondary | ICD-10-CM | POA: Diagnosis not present

## 2018-01-01 DIAGNOSIS — M549 Dorsalgia, unspecified: Secondary | ICD-10-CM | POA: Diagnosis not present

## 2018-01-01 DIAGNOSIS — I714 Abdominal aortic aneurysm, without rupture: Secondary | ICD-10-CM | POA: Diagnosis not present

## 2018-01-03 DIAGNOSIS — I10 Essential (primary) hypertension: Secondary | ICD-10-CM | POA: Diagnosis not present

## 2018-01-03 DIAGNOSIS — J449 Chronic obstructive pulmonary disease, unspecified: Secondary | ICD-10-CM | POA: Diagnosis not present

## 2018-03-04 DIAGNOSIS — I714 Abdominal aortic aneurysm, without rupture: Secondary | ICD-10-CM | POA: Diagnosis not present

## 2018-03-04 DIAGNOSIS — J449 Chronic obstructive pulmonary disease, unspecified: Secondary | ICD-10-CM | POA: Diagnosis not present

## 2018-03-04 DIAGNOSIS — Z299 Encounter for prophylactic measures, unspecified: Secondary | ICD-10-CM | POA: Diagnosis not present

## 2018-03-04 DIAGNOSIS — M549 Dorsalgia, unspecified: Secondary | ICD-10-CM | POA: Diagnosis not present

## 2018-03-04 DIAGNOSIS — I10 Essential (primary) hypertension: Secondary | ICD-10-CM | POA: Diagnosis not present

## 2018-03-04 DIAGNOSIS — Z6825 Body mass index (BMI) 25.0-25.9, adult: Secondary | ICD-10-CM | POA: Diagnosis not present

## 2018-03-17 DIAGNOSIS — J449 Chronic obstructive pulmonary disease, unspecified: Secondary | ICD-10-CM | POA: Diagnosis not present

## 2018-03-17 DIAGNOSIS — I10 Essential (primary) hypertension: Secondary | ICD-10-CM | POA: Diagnosis not present

## 2018-03-21 DIAGNOSIS — K219 Gastro-esophageal reflux disease without esophagitis: Secondary | ICD-10-CM | POA: Diagnosis not present

## 2018-03-21 DIAGNOSIS — E785 Hyperlipidemia, unspecified: Secondary | ICD-10-CM | POA: Diagnosis not present

## 2018-03-21 DIAGNOSIS — Z809 Family history of malignant neoplasm, unspecified: Secondary | ICD-10-CM | POA: Diagnosis not present

## 2018-03-21 DIAGNOSIS — K08409 Partial loss of teeth, unspecified cause, unspecified class: Secondary | ICD-10-CM | POA: Diagnosis not present

## 2018-03-21 DIAGNOSIS — Z87891 Personal history of nicotine dependence: Secondary | ICD-10-CM | POA: Diagnosis not present

## 2018-03-21 DIAGNOSIS — Z79891 Long term (current) use of opiate analgesic: Secondary | ICD-10-CM | POA: Diagnosis not present

## 2018-03-21 DIAGNOSIS — Z7982 Long term (current) use of aspirin: Secondary | ICD-10-CM | POA: Diagnosis not present

## 2018-03-21 DIAGNOSIS — J449 Chronic obstructive pulmonary disease, unspecified: Secondary | ICD-10-CM | POA: Diagnosis not present

## 2018-03-21 DIAGNOSIS — I10 Essential (primary) hypertension: Secondary | ICD-10-CM | POA: Diagnosis not present

## 2018-03-21 DIAGNOSIS — G8929 Other chronic pain: Secondary | ICD-10-CM | POA: Diagnosis not present

## 2018-04-08 DIAGNOSIS — Z299 Encounter for prophylactic measures, unspecified: Secondary | ICD-10-CM | POA: Diagnosis not present

## 2018-04-08 DIAGNOSIS — M549 Dorsalgia, unspecified: Secondary | ICD-10-CM | POA: Diagnosis not present

## 2018-04-08 DIAGNOSIS — I10 Essential (primary) hypertension: Secondary | ICD-10-CM | POA: Diagnosis not present

## 2018-04-08 DIAGNOSIS — I714 Abdominal aortic aneurysm, without rupture: Secondary | ICD-10-CM | POA: Diagnosis not present

## 2018-04-08 DIAGNOSIS — Z6825 Body mass index (BMI) 25.0-25.9, adult: Secondary | ICD-10-CM | POA: Diagnosis not present

## 2018-04-08 DIAGNOSIS — J449 Chronic obstructive pulmonary disease, unspecified: Secondary | ICD-10-CM | POA: Diagnosis not present

## 2018-04-08 DIAGNOSIS — R69 Illness, unspecified: Secondary | ICD-10-CM | POA: Diagnosis not present

## 2018-04-15 DIAGNOSIS — J449 Chronic obstructive pulmonary disease, unspecified: Secondary | ICD-10-CM | POA: Diagnosis not present

## 2018-04-15 DIAGNOSIS — I10 Essential (primary) hypertension: Secondary | ICD-10-CM | POA: Diagnosis not present

## 2018-05-09 DIAGNOSIS — I714 Abdominal aortic aneurysm, without rupture: Secondary | ICD-10-CM | POA: Diagnosis not present

## 2018-05-09 DIAGNOSIS — Z6825 Body mass index (BMI) 25.0-25.9, adult: Secondary | ICD-10-CM | POA: Diagnosis not present

## 2018-05-09 DIAGNOSIS — I10 Essential (primary) hypertension: Secondary | ICD-10-CM | POA: Diagnosis not present

## 2018-05-09 DIAGNOSIS — Z299 Encounter for prophylactic measures, unspecified: Secondary | ICD-10-CM | POA: Diagnosis not present

## 2018-05-09 DIAGNOSIS — J449 Chronic obstructive pulmonary disease, unspecified: Secondary | ICD-10-CM | POA: Diagnosis not present

## 2018-05-09 DIAGNOSIS — Z79899 Other long term (current) drug therapy: Secondary | ICD-10-CM | POA: Diagnosis not present

## 2018-06-09 DIAGNOSIS — Z299 Encounter for prophylactic measures, unspecified: Secondary | ICD-10-CM | POA: Diagnosis not present

## 2018-06-09 DIAGNOSIS — Z6825 Body mass index (BMI) 25.0-25.9, adult: Secondary | ICD-10-CM | POA: Diagnosis not present

## 2018-06-09 DIAGNOSIS — J449 Chronic obstructive pulmonary disease, unspecified: Secondary | ICD-10-CM | POA: Diagnosis not present

## 2018-06-09 DIAGNOSIS — M549 Dorsalgia, unspecified: Secondary | ICD-10-CM | POA: Diagnosis not present

## 2018-06-09 DIAGNOSIS — I714 Abdominal aortic aneurysm, without rupture: Secondary | ICD-10-CM | POA: Diagnosis not present

## 2018-06-09 DIAGNOSIS — Z87891 Personal history of nicotine dependence: Secondary | ICD-10-CM | POA: Diagnosis not present

## 2018-06-09 DIAGNOSIS — I10 Essential (primary) hypertension: Secondary | ICD-10-CM | POA: Diagnosis not present

## 2018-06-13 DIAGNOSIS — I10 Essential (primary) hypertension: Secondary | ICD-10-CM | POA: Diagnosis not present

## 2018-06-13 DIAGNOSIS — J449 Chronic obstructive pulmonary disease, unspecified: Secondary | ICD-10-CM | POA: Diagnosis not present

## 2018-07-09 DIAGNOSIS — J449 Chronic obstructive pulmonary disease, unspecified: Secondary | ICD-10-CM | POA: Diagnosis not present

## 2018-07-09 DIAGNOSIS — Z6825 Body mass index (BMI) 25.0-25.9, adult: Secondary | ICD-10-CM | POA: Diagnosis not present

## 2018-07-09 DIAGNOSIS — R69 Illness, unspecified: Secondary | ICD-10-CM | POA: Diagnosis not present

## 2018-07-09 DIAGNOSIS — I714 Abdominal aortic aneurysm, without rupture: Secondary | ICD-10-CM | POA: Diagnosis not present

## 2018-07-09 DIAGNOSIS — Z299 Encounter for prophylactic measures, unspecified: Secondary | ICD-10-CM | POA: Diagnosis not present

## 2018-07-09 DIAGNOSIS — I10 Essential (primary) hypertension: Secondary | ICD-10-CM | POA: Diagnosis not present

## 2018-07-28 DIAGNOSIS — J449 Chronic obstructive pulmonary disease, unspecified: Secondary | ICD-10-CM | POA: Diagnosis not present

## 2018-07-28 DIAGNOSIS — I10 Essential (primary) hypertension: Secondary | ICD-10-CM | POA: Diagnosis not present

## 2018-08-25 DIAGNOSIS — I10 Essential (primary) hypertension: Secondary | ICD-10-CM | POA: Diagnosis not present

## 2018-08-25 DIAGNOSIS — J449 Chronic obstructive pulmonary disease, unspecified: Secondary | ICD-10-CM | POA: Diagnosis not present

## 2018-08-27 DIAGNOSIS — R69 Illness, unspecified: Secondary | ICD-10-CM | POA: Diagnosis not present

## 2018-08-27 DIAGNOSIS — J449 Chronic obstructive pulmonary disease, unspecified: Secondary | ICD-10-CM | POA: Diagnosis not present

## 2018-08-27 DIAGNOSIS — Z299 Encounter for prophylactic measures, unspecified: Secondary | ICD-10-CM | POA: Diagnosis not present

## 2018-08-27 DIAGNOSIS — I10 Essential (primary) hypertension: Secondary | ICD-10-CM | POA: Diagnosis not present

## 2018-08-27 DIAGNOSIS — Z0000 Encounter for general adult medical examination without abnormal findings: Secondary | ICD-10-CM | POA: Diagnosis not present

## 2018-08-27 DIAGNOSIS — E7800 Pure hypercholesterolemia, unspecified: Secondary | ICD-10-CM | POA: Diagnosis not present

## 2018-08-27 DIAGNOSIS — Z6825 Body mass index (BMI) 25.0-25.9, adult: Secondary | ICD-10-CM | POA: Diagnosis not present

## 2018-08-27 DIAGNOSIS — Z1339 Encounter for screening examination for other mental health and behavioral disorders: Secondary | ICD-10-CM | POA: Diagnosis not present

## 2018-08-27 DIAGNOSIS — Z79899 Other long term (current) drug therapy: Secondary | ICD-10-CM | POA: Diagnosis not present

## 2018-08-27 DIAGNOSIS — Z7189 Other specified counseling: Secondary | ICD-10-CM | POA: Diagnosis not present

## 2018-08-27 DIAGNOSIS — Z1331 Encounter for screening for depression: Secondary | ICD-10-CM | POA: Diagnosis not present

## 2018-08-27 DIAGNOSIS — R5383 Other fatigue: Secondary | ICD-10-CM | POA: Diagnosis not present

## 2018-08-27 DIAGNOSIS — Z125 Encounter for screening for malignant neoplasm of prostate: Secondary | ICD-10-CM | POA: Diagnosis not present

## 2018-08-27 DIAGNOSIS — Z1211 Encounter for screening for malignant neoplasm of colon: Secondary | ICD-10-CM | POA: Diagnosis not present

## 2018-09-10 DIAGNOSIS — J438 Other emphysema: Secondary | ICD-10-CM | POA: Diagnosis not present

## 2018-09-22 DIAGNOSIS — I10 Essential (primary) hypertension: Secondary | ICD-10-CM | POA: Diagnosis not present

## 2018-09-22 DIAGNOSIS — J449 Chronic obstructive pulmonary disease, unspecified: Secondary | ICD-10-CM | POA: Diagnosis not present

## 2018-09-30 DIAGNOSIS — R69 Illness, unspecified: Secondary | ICD-10-CM | POA: Diagnosis not present

## 2018-10-20 DIAGNOSIS — J449 Chronic obstructive pulmonary disease, unspecified: Secondary | ICD-10-CM | POA: Diagnosis not present

## 2018-10-20 DIAGNOSIS — I10 Essential (primary) hypertension: Secondary | ICD-10-CM | POA: Diagnosis not present

## 2018-10-27 DIAGNOSIS — R42 Dizziness and giddiness: Secondary | ICD-10-CM | POA: Diagnosis not present

## 2018-10-27 DIAGNOSIS — Z6825 Body mass index (BMI) 25.0-25.9, adult: Secondary | ICD-10-CM | POA: Diagnosis not present

## 2018-10-27 DIAGNOSIS — J449 Chronic obstructive pulmonary disease, unspecified: Secondary | ICD-10-CM | POA: Diagnosis not present

## 2018-10-27 DIAGNOSIS — Z299 Encounter for prophylactic measures, unspecified: Secondary | ICD-10-CM | POA: Diagnosis not present

## 2018-10-27 DIAGNOSIS — I10 Essential (primary) hypertension: Secondary | ICD-10-CM | POA: Diagnosis not present

## 2018-10-27 DIAGNOSIS — M549 Dorsalgia, unspecified: Secondary | ICD-10-CM | POA: Diagnosis not present

## 2018-10-28 DIAGNOSIS — H81393 Other peripheral vertigo, bilateral: Secondary | ICD-10-CM | POA: Diagnosis not present

## 2018-10-28 DIAGNOSIS — I7389 Other specified peripheral vascular diseases: Secondary | ICD-10-CM | POA: Diagnosis not present

## 2018-10-28 DIAGNOSIS — G9009 Other idiopathic peripheral autonomic neuropathy: Secondary | ICD-10-CM | POA: Diagnosis not present

## 2018-11-18 DIAGNOSIS — Z299 Encounter for prophylactic measures, unspecified: Secondary | ICD-10-CM | POA: Diagnosis not present

## 2018-11-18 DIAGNOSIS — M549 Dorsalgia, unspecified: Secondary | ICD-10-CM | POA: Diagnosis not present

## 2018-11-18 DIAGNOSIS — R5383 Other fatigue: Secondary | ICD-10-CM | POA: Diagnosis not present

## 2018-11-18 DIAGNOSIS — I10 Essential (primary) hypertension: Secondary | ICD-10-CM | POA: Diagnosis not present

## 2018-11-18 DIAGNOSIS — R42 Dizziness and giddiness: Secondary | ICD-10-CM | POA: Diagnosis not present

## 2018-11-18 DIAGNOSIS — Z6826 Body mass index (BMI) 26.0-26.9, adult: Secondary | ICD-10-CM | POA: Diagnosis not present

## 2018-11-18 DIAGNOSIS — I714 Abdominal aortic aneurysm, without rupture: Secondary | ICD-10-CM | POA: Diagnosis not present

## 2018-12-18 DIAGNOSIS — Z6826 Body mass index (BMI) 26.0-26.9, adult: Secondary | ICD-10-CM | POA: Diagnosis not present

## 2018-12-18 DIAGNOSIS — I10 Essential (primary) hypertension: Secondary | ICD-10-CM | POA: Diagnosis not present

## 2018-12-18 DIAGNOSIS — I714 Abdominal aortic aneurysm, without rupture: Secondary | ICD-10-CM | POA: Diagnosis not present

## 2018-12-18 DIAGNOSIS — J449 Chronic obstructive pulmonary disease, unspecified: Secondary | ICD-10-CM | POA: Diagnosis not present

## 2018-12-18 DIAGNOSIS — Z299 Encounter for prophylactic measures, unspecified: Secondary | ICD-10-CM | POA: Diagnosis not present

## 2019-01-07 DIAGNOSIS — I10 Essential (primary) hypertension: Secondary | ICD-10-CM | POA: Diagnosis not present

## 2019-01-07 DIAGNOSIS — J449 Chronic obstructive pulmonary disease, unspecified: Secondary | ICD-10-CM | POA: Diagnosis not present

## 2019-01-19 DIAGNOSIS — R69 Illness, unspecified: Secondary | ICD-10-CM | POA: Diagnosis not present

## 2019-01-19 DIAGNOSIS — E7800 Pure hypercholesterolemia, unspecified: Secondary | ICD-10-CM | POA: Diagnosis not present

## 2019-01-19 DIAGNOSIS — Z299 Encounter for prophylactic measures, unspecified: Secondary | ICD-10-CM | POA: Diagnosis not present

## 2019-01-19 DIAGNOSIS — M549 Dorsalgia, unspecified: Secondary | ICD-10-CM | POA: Diagnosis not present

## 2019-01-19 DIAGNOSIS — I10 Essential (primary) hypertension: Secondary | ICD-10-CM | POA: Diagnosis not present

## 2019-01-19 DIAGNOSIS — J449 Chronic obstructive pulmonary disease, unspecified: Secondary | ICD-10-CM | POA: Diagnosis not present

## 2019-01-19 DIAGNOSIS — Z6825 Body mass index (BMI) 25.0-25.9, adult: Secondary | ICD-10-CM | POA: Diagnosis not present

## 2019-02-13 DIAGNOSIS — J449 Chronic obstructive pulmonary disease, unspecified: Secondary | ICD-10-CM | POA: Diagnosis not present

## 2019-02-13 DIAGNOSIS — I10 Essential (primary) hypertension: Secondary | ICD-10-CM | POA: Diagnosis not present

## 2019-02-19 DIAGNOSIS — M549 Dorsalgia, unspecified: Secondary | ICD-10-CM | POA: Diagnosis not present

## 2019-02-19 DIAGNOSIS — J449 Chronic obstructive pulmonary disease, unspecified: Secondary | ICD-10-CM | POA: Diagnosis not present

## 2019-02-19 DIAGNOSIS — I10 Essential (primary) hypertension: Secondary | ICD-10-CM | POA: Diagnosis not present

## 2019-02-19 DIAGNOSIS — Z299 Encounter for prophylactic measures, unspecified: Secondary | ICD-10-CM | POA: Diagnosis not present

## 2019-02-19 DIAGNOSIS — I714 Abdominal aortic aneurysm, without rupture: Secondary | ICD-10-CM | POA: Diagnosis not present

## 2019-02-19 DIAGNOSIS — Z6826 Body mass index (BMI) 26.0-26.9, adult: Secondary | ICD-10-CM | POA: Diagnosis not present

## 2019-03-20 DIAGNOSIS — Z6826 Body mass index (BMI) 26.0-26.9, adult: Secondary | ICD-10-CM | POA: Diagnosis not present

## 2019-03-20 DIAGNOSIS — I10 Essential (primary) hypertension: Secondary | ICD-10-CM | POA: Diagnosis not present

## 2019-03-20 DIAGNOSIS — Z299 Encounter for prophylactic measures, unspecified: Secondary | ICD-10-CM | POA: Diagnosis not present

## 2019-03-20 DIAGNOSIS — M549 Dorsalgia, unspecified: Secondary | ICD-10-CM | POA: Diagnosis not present

## 2019-03-20 DIAGNOSIS — Z79899 Other long term (current) drug therapy: Secondary | ICD-10-CM | POA: Diagnosis not present

## 2019-03-20 DIAGNOSIS — I714 Abdominal aortic aneurysm, without rupture: Secondary | ICD-10-CM | POA: Diagnosis not present

## 2019-04-20 DIAGNOSIS — I10 Essential (primary) hypertension: Secondary | ICD-10-CM | POA: Diagnosis not present

## 2019-04-20 DIAGNOSIS — Z6826 Body mass index (BMI) 26.0-26.9, adult: Secondary | ICD-10-CM | POA: Diagnosis not present

## 2019-04-20 DIAGNOSIS — Z299 Encounter for prophylactic measures, unspecified: Secondary | ICD-10-CM | POA: Diagnosis not present

## 2019-04-20 DIAGNOSIS — M549 Dorsalgia, unspecified: Secondary | ICD-10-CM | POA: Diagnosis not present

## 2019-04-20 DIAGNOSIS — Z713 Dietary counseling and surveillance: Secondary | ICD-10-CM | POA: Diagnosis not present

## 2019-05-20 DIAGNOSIS — Z299 Encounter for prophylactic measures, unspecified: Secondary | ICD-10-CM | POA: Diagnosis not present

## 2019-05-20 DIAGNOSIS — J449 Chronic obstructive pulmonary disease, unspecified: Secondary | ICD-10-CM | POA: Diagnosis not present

## 2019-05-20 DIAGNOSIS — Z6826 Body mass index (BMI) 26.0-26.9, adult: Secondary | ICD-10-CM | POA: Diagnosis not present

## 2019-05-20 DIAGNOSIS — I714 Abdominal aortic aneurysm, without rupture: Secondary | ICD-10-CM | POA: Diagnosis not present

## 2019-05-20 DIAGNOSIS — M549 Dorsalgia, unspecified: Secondary | ICD-10-CM | POA: Diagnosis not present

## 2019-05-20 DIAGNOSIS — I10 Essential (primary) hypertension: Secondary | ICD-10-CM | POA: Diagnosis not present

## 2019-06-22 DIAGNOSIS — Z6826 Body mass index (BMI) 26.0-26.9, adult: Secondary | ICD-10-CM | POA: Diagnosis not present

## 2019-06-22 DIAGNOSIS — I10 Essential (primary) hypertension: Secondary | ICD-10-CM | POA: Diagnosis not present

## 2019-06-22 DIAGNOSIS — I714 Abdominal aortic aneurysm, without rupture: Secondary | ICD-10-CM | POA: Diagnosis not present

## 2019-06-22 DIAGNOSIS — Z299 Encounter for prophylactic measures, unspecified: Secondary | ICD-10-CM | POA: Diagnosis not present

## 2019-06-22 DIAGNOSIS — M549 Dorsalgia, unspecified: Secondary | ICD-10-CM | POA: Diagnosis not present

## 2019-06-22 DIAGNOSIS — J449 Chronic obstructive pulmonary disease, unspecified: Secondary | ICD-10-CM | POA: Diagnosis not present

## 2019-07-21 DIAGNOSIS — I10 Essential (primary) hypertension: Secondary | ICD-10-CM | POA: Diagnosis not present

## 2019-07-21 DIAGNOSIS — R69 Illness, unspecified: Secondary | ICD-10-CM | POA: Diagnosis not present

## 2019-07-21 DIAGNOSIS — J449 Chronic obstructive pulmonary disease, unspecified: Secondary | ICD-10-CM | POA: Diagnosis not present

## 2019-07-21 DIAGNOSIS — Z6826 Body mass index (BMI) 26.0-26.9, adult: Secondary | ICD-10-CM | POA: Diagnosis not present

## 2019-07-21 DIAGNOSIS — I714 Abdominal aortic aneurysm, without rupture: Secondary | ICD-10-CM | POA: Diagnosis not present

## 2019-07-21 DIAGNOSIS — Z299 Encounter for prophylactic measures, unspecified: Secondary | ICD-10-CM | POA: Diagnosis not present

## 2019-07-27 DIAGNOSIS — I10 Essential (primary) hypertension: Secondary | ICD-10-CM | POA: Diagnosis not present

## 2019-08-16 DIAGNOSIS — R69 Illness, unspecified: Secondary | ICD-10-CM | POA: Diagnosis not present

## 2019-08-20 DIAGNOSIS — Z299 Encounter for prophylactic measures, unspecified: Secondary | ICD-10-CM | POA: Diagnosis not present

## 2019-08-20 DIAGNOSIS — Z6825 Body mass index (BMI) 25.0-25.9, adult: Secondary | ICD-10-CM | POA: Diagnosis not present

## 2019-08-20 DIAGNOSIS — R69 Illness, unspecified: Secondary | ICD-10-CM | POA: Diagnosis not present

## 2019-08-20 DIAGNOSIS — I10 Essential (primary) hypertension: Secondary | ICD-10-CM | POA: Diagnosis not present

## 2019-08-20 DIAGNOSIS — J449 Chronic obstructive pulmonary disease, unspecified: Secondary | ICD-10-CM | POA: Diagnosis not present

## 2019-08-20 DIAGNOSIS — M549 Dorsalgia, unspecified: Secondary | ICD-10-CM | POA: Diagnosis not present

## 2019-08-20 DIAGNOSIS — I714 Abdominal aortic aneurysm, without rupture: Secondary | ICD-10-CM | POA: Diagnosis not present

## 2019-08-27 DIAGNOSIS — I10 Essential (primary) hypertension: Secondary | ICD-10-CM | POA: Diagnosis not present

## 2019-09-02 DIAGNOSIS — R69 Illness, unspecified: Secondary | ICD-10-CM | POA: Diagnosis not present

## 2019-09-02 DIAGNOSIS — Z1331 Encounter for screening for depression: Secondary | ICD-10-CM | POA: Diagnosis not present

## 2019-09-02 DIAGNOSIS — J449 Chronic obstructive pulmonary disease, unspecified: Secondary | ICD-10-CM | POA: Diagnosis not present

## 2019-09-02 DIAGNOSIS — Z79899 Other long term (current) drug therapy: Secondary | ICD-10-CM | POA: Diagnosis not present

## 2019-09-02 DIAGNOSIS — Z1339 Encounter for screening examination for other mental health and behavioral disorders: Secondary | ICD-10-CM | POA: Diagnosis not present

## 2019-09-02 DIAGNOSIS — I10 Essential (primary) hypertension: Secondary | ICD-10-CM | POA: Diagnosis not present

## 2019-09-02 DIAGNOSIS — Z125 Encounter for screening for malignant neoplasm of prostate: Secondary | ICD-10-CM | POA: Diagnosis not present

## 2019-09-02 DIAGNOSIS — R5383 Other fatigue: Secondary | ICD-10-CM | POA: Diagnosis not present

## 2019-09-02 DIAGNOSIS — E7800 Pure hypercholesterolemia, unspecified: Secondary | ICD-10-CM | POA: Diagnosis not present

## 2019-09-02 DIAGNOSIS — Z0000 Encounter for general adult medical examination without abnormal findings: Secondary | ICD-10-CM | POA: Diagnosis not present

## 2019-09-02 DIAGNOSIS — Z7189 Other specified counseling: Secondary | ICD-10-CM | POA: Diagnosis not present

## 2019-09-02 DIAGNOSIS — Z1211 Encounter for screening for malignant neoplasm of colon: Secondary | ICD-10-CM | POA: Diagnosis not present

## 2019-09-21 DIAGNOSIS — I714 Abdominal aortic aneurysm, without rupture: Secondary | ICD-10-CM | POA: Diagnosis not present

## 2019-09-23 DIAGNOSIS — M549 Dorsalgia, unspecified: Secondary | ICD-10-CM | POA: Diagnosis not present

## 2019-09-23 DIAGNOSIS — R69 Illness, unspecified: Secondary | ICD-10-CM | POA: Diagnosis not present

## 2019-09-23 DIAGNOSIS — I10 Essential (primary) hypertension: Secondary | ICD-10-CM | POA: Diagnosis not present

## 2019-09-23 DIAGNOSIS — N183 Chronic kidney disease, stage 3 (moderate): Secondary | ICD-10-CM | POA: Diagnosis not present

## 2019-09-23 DIAGNOSIS — I714 Abdominal aortic aneurysm, without rupture: Secondary | ICD-10-CM | POA: Diagnosis not present

## 2019-09-23 DIAGNOSIS — J449 Chronic obstructive pulmonary disease, unspecified: Secondary | ICD-10-CM | POA: Diagnosis not present

## 2019-09-23 DIAGNOSIS — Z299 Encounter for prophylactic measures, unspecified: Secondary | ICD-10-CM | POA: Diagnosis not present

## 2019-09-23 DIAGNOSIS — Z6825 Body mass index (BMI) 25.0-25.9, adult: Secondary | ICD-10-CM | POA: Diagnosis not present

## 2019-09-29 DIAGNOSIS — I10 Essential (primary) hypertension: Secondary | ICD-10-CM | POA: Diagnosis not present

## 2019-10-23 DIAGNOSIS — I714 Abdominal aortic aneurysm, without rupture: Secondary | ICD-10-CM | POA: Diagnosis not present

## 2019-10-23 DIAGNOSIS — J449 Chronic obstructive pulmonary disease, unspecified: Secondary | ICD-10-CM | POA: Diagnosis not present

## 2019-10-23 DIAGNOSIS — M549 Dorsalgia, unspecified: Secondary | ICD-10-CM | POA: Diagnosis not present

## 2019-10-23 DIAGNOSIS — Z299 Encounter for prophylactic measures, unspecified: Secondary | ICD-10-CM | POA: Diagnosis not present

## 2019-10-23 DIAGNOSIS — Z6825 Body mass index (BMI) 25.0-25.9, adult: Secondary | ICD-10-CM | POA: Diagnosis not present

## 2019-10-23 DIAGNOSIS — I10 Essential (primary) hypertension: Secondary | ICD-10-CM | POA: Diagnosis not present

## 2019-10-28 DIAGNOSIS — I10 Essential (primary) hypertension: Secondary | ICD-10-CM | POA: Diagnosis not present

## 2019-11-25 DIAGNOSIS — Z299 Encounter for prophylactic measures, unspecified: Secondary | ICD-10-CM | POA: Diagnosis not present

## 2019-11-25 DIAGNOSIS — N183 Chronic kidney disease, stage 3 unspecified: Secondary | ICD-10-CM | POA: Diagnosis not present

## 2019-11-25 DIAGNOSIS — M549 Dorsalgia, unspecified: Secondary | ICD-10-CM | POA: Diagnosis not present

## 2019-11-25 DIAGNOSIS — I714 Abdominal aortic aneurysm, without rupture: Secondary | ICD-10-CM | POA: Diagnosis not present

## 2019-11-25 DIAGNOSIS — R69 Illness, unspecified: Secondary | ICD-10-CM | POA: Diagnosis not present

## 2019-11-25 DIAGNOSIS — I10 Essential (primary) hypertension: Secondary | ICD-10-CM | POA: Diagnosis not present

## 2019-11-25 DIAGNOSIS — Z6825 Body mass index (BMI) 25.0-25.9, adult: Secondary | ICD-10-CM | POA: Diagnosis not present

## 2019-11-30 DIAGNOSIS — I10 Essential (primary) hypertension: Secondary | ICD-10-CM | POA: Diagnosis not present

## 2019-12-09 DIAGNOSIS — G8929 Other chronic pain: Secondary | ICD-10-CM | POA: Diagnosis not present

## 2019-12-09 DIAGNOSIS — R69 Illness, unspecified: Secondary | ICD-10-CM | POA: Diagnosis not present

## 2019-12-09 DIAGNOSIS — E785 Hyperlipidemia, unspecified: Secondary | ICD-10-CM | POA: Diagnosis not present

## 2019-12-09 DIAGNOSIS — I10 Essential (primary) hypertension: Secondary | ICD-10-CM | POA: Diagnosis not present

## 2019-12-09 DIAGNOSIS — Z7982 Long term (current) use of aspirin: Secondary | ICD-10-CM | POA: Diagnosis not present

## 2019-12-09 DIAGNOSIS — Z79891 Long term (current) use of opiate analgesic: Secondary | ICD-10-CM | POA: Diagnosis not present

## 2019-12-09 DIAGNOSIS — K5900 Constipation, unspecified: Secondary | ICD-10-CM | POA: Diagnosis not present

## 2019-12-09 DIAGNOSIS — T753XXD Motion sickness, subsequent encounter: Secondary | ICD-10-CM | POA: Diagnosis not present

## 2019-12-23 DIAGNOSIS — R69 Illness, unspecified: Secondary | ICD-10-CM | POA: Diagnosis not present

## 2019-12-23 DIAGNOSIS — Z6825 Body mass index (BMI) 25.0-25.9, adult: Secondary | ICD-10-CM | POA: Diagnosis not present

## 2019-12-23 DIAGNOSIS — J449 Chronic obstructive pulmonary disease, unspecified: Secondary | ICD-10-CM | POA: Diagnosis not present

## 2019-12-23 DIAGNOSIS — M549 Dorsalgia, unspecified: Secondary | ICD-10-CM | POA: Diagnosis not present

## 2019-12-23 DIAGNOSIS — I10 Essential (primary) hypertension: Secondary | ICD-10-CM | POA: Diagnosis not present

## 2019-12-23 DIAGNOSIS — N183 Chronic kidney disease, stage 3 unspecified: Secondary | ICD-10-CM | POA: Diagnosis not present

## 2019-12-23 DIAGNOSIS — Z299 Encounter for prophylactic measures, unspecified: Secondary | ICD-10-CM | POA: Diagnosis not present

## 2020-01-25 DIAGNOSIS — M549 Dorsalgia, unspecified: Secondary | ICD-10-CM | POA: Diagnosis not present

## 2020-01-25 DIAGNOSIS — R69 Illness, unspecified: Secondary | ICD-10-CM | POA: Diagnosis not present

## 2020-01-25 DIAGNOSIS — Z6825 Body mass index (BMI) 25.0-25.9, adult: Secondary | ICD-10-CM | POA: Diagnosis not present

## 2020-01-25 DIAGNOSIS — Z299 Encounter for prophylactic measures, unspecified: Secondary | ICD-10-CM | POA: Diagnosis not present

## 2020-01-25 DIAGNOSIS — I10 Essential (primary) hypertension: Secondary | ICD-10-CM | POA: Diagnosis not present

## 2020-01-26 DIAGNOSIS — I10 Essential (primary) hypertension: Secondary | ICD-10-CM | POA: Diagnosis not present

## 2020-02-23 DIAGNOSIS — J449 Chronic obstructive pulmonary disease, unspecified: Secondary | ICD-10-CM | POA: Diagnosis not present

## 2020-02-23 DIAGNOSIS — I10 Essential (primary) hypertension: Secondary | ICD-10-CM | POA: Diagnosis not present

## 2020-02-23 DIAGNOSIS — Z6825 Body mass index (BMI) 25.0-25.9, adult: Secondary | ICD-10-CM | POA: Diagnosis not present

## 2020-02-23 DIAGNOSIS — R69 Illness, unspecified: Secondary | ICD-10-CM | POA: Diagnosis not present

## 2020-02-23 DIAGNOSIS — F17210 Nicotine dependence, cigarettes, uncomplicated: Secondary | ICD-10-CM | POA: Diagnosis not present

## 2020-02-23 DIAGNOSIS — Z79899 Other long term (current) drug therapy: Secondary | ICD-10-CM | POA: Diagnosis not present

## 2020-02-23 DIAGNOSIS — M549 Dorsalgia, unspecified: Secondary | ICD-10-CM | POA: Diagnosis not present

## 2020-02-23 DIAGNOSIS — Z299 Encounter for prophylactic measures, unspecified: Secondary | ICD-10-CM | POA: Diagnosis not present

## 2020-02-28 DIAGNOSIS — I10 Essential (primary) hypertension: Secondary | ICD-10-CM | POA: Diagnosis not present

## 2020-03-24 DIAGNOSIS — N183 Chronic kidney disease, stage 3 unspecified: Secondary | ICD-10-CM | POA: Diagnosis not present

## 2020-03-24 DIAGNOSIS — E7800 Pure hypercholesterolemia, unspecified: Secondary | ICD-10-CM | POA: Diagnosis not present

## 2020-03-24 DIAGNOSIS — Z79899 Other long term (current) drug therapy: Secondary | ICD-10-CM | POA: Diagnosis not present

## 2020-03-24 DIAGNOSIS — R69 Illness, unspecified: Secondary | ICD-10-CM | POA: Diagnosis not present

## 2020-03-24 DIAGNOSIS — Z6825 Body mass index (BMI) 25.0-25.9, adult: Secondary | ICD-10-CM | POA: Diagnosis not present

## 2020-03-24 DIAGNOSIS — Z299 Encounter for prophylactic measures, unspecified: Secondary | ICD-10-CM | POA: Diagnosis not present

## 2020-03-24 DIAGNOSIS — I10 Essential (primary) hypertension: Secondary | ICD-10-CM | POA: Diagnosis not present

## 2020-03-24 DIAGNOSIS — M549 Dorsalgia, unspecified: Secondary | ICD-10-CM | POA: Diagnosis not present

## 2020-03-29 DIAGNOSIS — I10 Essential (primary) hypertension: Secondary | ICD-10-CM | POA: Diagnosis not present

## 2020-04-25 DIAGNOSIS — N183 Chronic kidney disease, stage 3 unspecified: Secondary | ICD-10-CM | POA: Diagnosis not present

## 2020-04-25 DIAGNOSIS — J449 Chronic obstructive pulmonary disease, unspecified: Secondary | ICD-10-CM | POA: Diagnosis not present

## 2020-04-25 DIAGNOSIS — R69 Illness, unspecified: Secondary | ICD-10-CM | POA: Diagnosis not present

## 2020-04-25 DIAGNOSIS — I10 Essential (primary) hypertension: Secondary | ICD-10-CM | POA: Diagnosis not present

## 2020-04-25 DIAGNOSIS — I714 Abdominal aortic aneurysm, without rupture: Secondary | ICD-10-CM | POA: Diagnosis not present

## 2020-04-25 DIAGNOSIS — Z299 Encounter for prophylactic measures, unspecified: Secondary | ICD-10-CM | POA: Diagnosis not present

## 2020-04-29 DIAGNOSIS — I10 Essential (primary) hypertension: Secondary | ICD-10-CM | POA: Diagnosis not present

## 2020-05-05 DIAGNOSIS — R69 Illness, unspecified: Secondary | ICD-10-CM | POA: Diagnosis not present

## 2020-05-05 DIAGNOSIS — G8929 Other chronic pain: Secondary | ICD-10-CM | POA: Diagnosis not present

## 2020-05-05 DIAGNOSIS — Z008 Encounter for other general examination: Secondary | ICD-10-CM | POA: Diagnosis not present

## 2020-05-05 DIAGNOSIS — G3184 Mild cognitive impairment, so stated: Secondary | ICD-10-CM | POA: Diagnosis not present

## 2020-05-05 DIAGNOSIS — I10 Essential (primary) hypertension: Secondary | ICD-10-CM | POA: Diagnosis not present

## 2020-05-05 DIAGNOSIS — N529 Male erectile dysfunction, unspecified: Secondary | ICD-10-CM | POA: Diagnosis not present

## 2020-05-05 DIAGNOSIS — E785 Hyperlipidemia, unspecified: Secondary | ICD-10-CM | POA: Diagnosis not present

## 2020-05-05 DIAGNOSIS — I739 Peripheral vascular disease, unspecified: Secondary | ICD-10-CM | POA: Diagnosis not present

## 2020-05-05 DIAGNOSIS — J439 Emphysema, unspecified: Secondary | ICD-10-CM | POA: Diagnosis not present

## 2020-05-05 DIAGNOSIS — K5900 Constipation, unspecified: Secondary | ICD-10-CM | POA: Diagnosis not present

## 2020-05-05 DIAGNOSIS — K219 Gastro-esophageal reflux disease without esophagitis: Secondary | ICD-10-CM | POA: Diagnosis not present

## 2020-05-23 DIAGNOSIS — M549 Dorsalgia, unspecified: Secondary | ICD-10-CM | POA: Diagnosis not present

## 2020-05-23 DIAGNOSIS — Z6824 Body mass index (BMI) 24.0-24.9, adult: Secondary | ICD-10-CM | POA: Diagnosis not present

## 2020-05-23 DIAGNOSIS — K5900 Constipation, unspecified: Secondary | ICD-10-CM | POA: Diagnosis not present

## 2020-05-23 DIAGNOSIS — Z299 Encounter for prophylactic measures, unspecified: Secondary | ICD-10-CM | POA: Diagnosis not present

## 2020-05-23 DIAGNOSIS — R69 Illness, unspecified: Secondary | ICD-10-CM | POA: Diagnosis not present

## 2020-05-23 DIAGNOSIS — I10 Essential (primary) hypertension: Secondary | ICD-10-CM | POA: Diagnosis not present

## 2020-05-29 DIAGNOSIS — I10 Essential (primary) hypertension: Secondary | ICD-10-CM | POA: Diagnosis not present

## 2020-06-23 DIAGNOSIS — Z299 Encounter for prophylactic measures, unspecified: Secondary | ICD-10-CM | POA: Diagnosis not present

## 2020-06-23 DIAGNOSIS — I10 Essential (primary) hypertension: Secondary | ICD-10-CM | POA: Diagnosis not present

## 2020-06-23 DIAGNOSIS — I714 Abdominal aortic aneurysm, without rupture: Secondary | ICD-10-CM | POA: Diagnosis not present

## 2020-06-23 DIAGNOSIS — R69 Illness, unspecified: Secondary | ICD-10-CM | POA: Diagnosis not present

## 2020-06-23 DIAGNOSIS — N183 Chronic kidney disease, stage 3 unspecified: Secondary | ICD-10-CM | POA: Diagnosis not present

## 2020-06-23 DIAGNOSIS — J449 Chronic obstructive pulmonary disease, unspecified: Secondary | ICD-10-CM | POA: Diagnosis not present

## 2020-06-29 DIAGNOSIS — I10 Essential (primary) hypertension: Secondary | ICD-10-CM | POA: Diagnosis not present

## 2020-07-22 DIAGNOSIS — Z6824 Body mass index (BMI) 24.0-24.9, adult: Secondary | ICD-10-CM | POA: Diagnosis not present

## 2020-07-22 DIAGNOSIS — N183 Chronic kidney disease, stage 3 unspecified: Secondary | ICD-10-CM | POA: Diagnosis not present

## 2020-07-22 DIAGNOSIS — I10 Essential (primary) hypertension: Secondary | ICD-10-CM | POA: Diagnosis not present

## 2020-07-22 DIAGNOSIS — J449 Chronic obstructive pulmonary disease, unspecified: Secondary | ICD-10-CM | POA: Diagnosis not present

## 2020-07-22 DIAGNOSIS — R69 Illness, unspecified: Secondary | ICD-10-CM | POA: Diagnosis not present

## 2020-07-22 DIAGNOSIS — Z299 Encounter for prophylactic measures, unspecified: Secondary | ICD-10-CM | POA: Diagnosis not present

## 2020-07-29 DIAGNOSIS — I10 Essential (primary) hypertension: Secondary | ICD-10-CM | POA: Diagnosis not present

## 2020-08-19 DIAGNOSIS — I714 Abdominal aortic aneurysm, without rupture: Secondary | ICD-10-CM | POA: Diagnosis not present

## 2020-08-19 DIAGNOSIS — Z299 Encounter for prophylactic measures, unspecified: Secondary | ICD-10-CM | POA: Diagnosis not present

## 2020-08-19 DIAGNOSIS — R69 Illness, unspecified: Secondary | ICD-10-CM | POA: Diagnosis not present

## 2020-08-19 DIAGNOSIS — K219 Gastro-esophageal reflux disease without esophagitis: Secondary | ICD-10-CM | POA: Diagnosis not present

## 2020-08-19 DIAGNOSIS — I10 Essential (primary) hypertension: Secondary | ICD-10-CM | POA: Diagnosis not present

## 2020-08-19 DIAGNOSIS — M549 Dorsalgia, unspecified: Secondary | ICD-10-CM | POA: Diagnosis not present

## 2020-08-30 DIAGNOSIS — I10 Essential (primary) hypertension: Secondary | ICD-10-CM | POA: Diagnosis not present

## 2020-09-06 DIAGNOSIS — Z6824 Body mass index (BMI) 24.0-24.9, adult: Secondary | ICD-10-CM | POA: Diagnosis not present

## 2020-09-06 DIAGNOSIS — Z79899 Other long term (current) drug therapy: Secondary | ICD-10-CM | POA: Diagnosis not present

## 2020-09-06 DIAGNOSIS — E7800 Pure hypercholesterolemia, unspecified: Secondary | ICD-10-CM | POA: Diagnosis not present

## 2020-09-06 DIAGNOSIS — Z7189 Other specified counseling: Secondary | ICD-10-CM | POA: Diagnosis not present

## 2020-09-06 DIAGNOSIS — Z125 Encounter for screening for malignant neoplasm of prostate: Secondary | ICD-10-CM | POA: Diagnosis not present

## 2020-09-06 DIAGNOSIS — R5383 Other fatigue: Secondary | ICD-10-CM | POA: Diagnosis not present

## 2020-09-06 DIAGNOSIS — Z299 Encounter for prophylactic measures, unspecified: Secondary | ICD-10-CM | POA: Diagnosis not present

## 2020-09-06 DIAGNOSIS — I10 Essential (primary) hypertension: Secondary | ICD-10-CM | POA: Diagnosis not present

## 2020-09-06 DIAGNOSIS — Z1339 Encounter for screening examination for other mental health and behavioral disorders: Secondary | ICD-10-CM | POA: Diagnosis not present

## 2020-09-06 DIAGNOSIS — Z1331 Encounter for screening for depression: Secondary | ICD-10-CM | POA: Diagnosis not present

## 2020-09-06 DIAGNOSIS — Z0000 Encounter for general adult medical examination without abnormal findings: Secondary | ICD-10-CM | POA: Diagnosis not present

## 2020-09-27 DIAGNOSIS — R69 Illness, unspecified: Secondary | ICD-10-CM | POA: Diagnosis not present

## 2020-09-29 DIAGNOSIS — I10 Essential (primary) hypertension: Secondary | ICD-10-CM | POA: Diagnosis not present

## 2020-09-30 DIAGNOSIS — I10 Essential (primary) hypertension: Secondary | ICD-10-CM | POA: Diagnosis not present

## 2020-09-30 DIAGNOSIS — M549 Dorsalgia, unspecified: Secondary | ICD-10-CM | POA: Diagnosis not present

## 2020-09-30 DIAGNOSIS — I714 Abdominal aortic aneurysm, without rupture: Secondary | ICD-10-CM | POA: Diagnosis not present

## 2020-09-30 DIAGNOSIS — R69 Illness, unspecified: Secondary | ICD-10-CM | POA: Diagnosis not present

## 2020-09-30 DIAGNOSIS — Z299 Encounter for prophylactic measures, unspecified: Secondary | ICD-10-CM | POA: Diagnosis not present

## 2020-10-24 DIAGNOSIS — Z6823 Body mass index (BMI) 23.0-23.9, adult: Secondary | ICD-10-CM | POA: Diagnosis not present

## 2020-10-24 DIAGNOSIS — Z299 Encounter for prophylactic measures, unspecified: Secondary | ICD-10-CM | POA: Diagnosis not present

## 2020-10-24 DIAGNOSIS — I714 Abdominal aortic aneurysm, without rupture: Secondary | ICD-10-CM | POA: Diagnosis not present

## 2020-10-24 DIAGNOSIS — J449 Chronic obstructive pulmonary disease, unspecified: Secondary | ICD-10-CM | POA: Diagnosis not present

## 2020-10-24 DIAGNOSIS — R69 Illness, unspecified: Secondary | ICD-10-CM | POA: Diagnosis not present

## 2020-10-24 DIAGNOSIS — I10 Essential (primary) hypertension: Secondary | ICD-10-CM | POA: Diagnosis not present

## 2020-10-29 DIAGNOSIS — I10 Essential (primary) hypertension: Secondary | ICD-10-CM | POA: Diagnosis not present

## 2020-11-25 DIAGNOSIS — N183 Chronic kidney disease, stage 3 unspecified: Secondary | ICD-10-CM | POA: Diagnosis not present

## 2020-11-25 DIAGNOSIS — I10 Essential (primary) hypertension: Secondary | ICD-10-CM | POA: Diagnosis not present

## 2020-11-25 DIAGNOSIS — Z299 Encounter for prophylactic measures, unspecified: Secondary | ICD-10-CM | POA: Diagnosis not present

## 2020-11-25 DIAGNOSIS — M549 Dorsalgia, unspecified: Secondary | ICD-10-CM | POA: Diagnosis not present

## 2020-11-25 DIAGNOSIS — R69 Illness, unspecified: Secondary | ICD-10-CM | POA: Diagnosis not present

## 2020-11-29 DIAGNOSIS — I10 Essential (primary) hypertension: Secondary | ICD-10-CM | POA: Diagnosis not present

## 2020-12-05 DIAGNOSIS — I714 Abdominal aortic aneurysm, without rupture: Secondary | ICD-10-CM | POA: Diagnosis not present

## 2020-12-28 DIAGNOSIS — R69 Illness, unspecified: Secondary | ICD-10-CM | POA: Diagnosis not present

## 2020-12-28 DIAGNOSIS — Z299 Encounter for prophylactic measures, unspecified: Secondary | ICD-10-CM | POA: Diagnosis not present

## 2020-12-28 DIAGNOSIS — J449 Chronic obstructive pulmonary disease, unspecified: Secondary | ICD-10-CM | POA: Diagnosis not present

## 2020-12-28 DIAGNOSIS — I10 Essential (primary) hypertension: Secondary | ICD-10-CM | POA: Diagnosis not present

## 2020-12-28 DIAGNOSIS — N183 Chronic kidney disease, stage 3 unspecified: Secondary | ICD-10-CM | POA: Diagnosis not present

## 2020-12-28 DIAGNOSIS — M549 Dorsalgia, unspecified: Secondary | ICD-10-CM | POA: Diagnosis not present

## 2020-12-29 DIAGNOSIS — I10 Essential (primary) hypertension: Secondary | ICD-10-CM | POA: Diagnosis not present

## 2021-01-30 DIAGNOSIS — I714 Abdominal aortic aneurysm, without rupture: Secondary | ICD-10-CM | POA: Diagnosis not present

## 2021-01-30 DIAGNOSIS — K219 Gastro-esophageal reflux disease without esophagitis: Secondary | ICD-10-CM | POA: Diagnosis not present

## 2021-01-30 DIAGNOSIS — Z299 Encounter for prophylactic measures, unspecified: Secondary | ICD-10-CM | POA: Diagnosis not present

## 2021-01-30 DIAGNOSIS — I10 Essential (primary) hypertension: Secondary | ICD-10-CM | POA: Diagnosis not present

## 2021-01-30 DIAGNOSIS — R69 Illness, unspecified: Secondary | ICD-10-CM | POA: Diagnosis not present

## 2021-01-30 DIAGNOSIS — F1120 Opioid dependence, uncomplicated: Secondary | ICD-10-CM | POA: Diagnosis not present

## 2021-01-30 DIAGNOSIS — Z6824 Body mass index (BMI) 24.0-24.9, adult: Secondary | ICD-10-CM | POA: Diagnosis not present

## 2021-01-30 DIAGNOSIS — F17210 Nicotine dependence, cigarettes, uncomplicated: Secondary | ICD-10-CM | POA: Diagnosis not present

## 2021-02-27 DIAGNOSIS — N183 Chronic kidney disease, stage 3 unspecified: Secondary | ICD-10-CM | POA: Diagnosis not present

## 2021-02-27 DIAGNOSIS — R69 Illness, unspecified: Secondary | ICD-10-CM | POA: Diagnosis not present

## 2021-02-27 DIAGNOSIS — F17210 Nicotine dependence, cigarettes, uncomplicated: Secondary | ICD-10-CM | POA: Diagnosis not present

## 2021-02-27 DIAGNOSIS — Z6824 Body mass index (BMI) 24.0-24.9, adult: Secondary | ICD-10-CM | POA: Diagnosis not present

## 2021-02-27 DIAGNOSIS — M549 Dorsalgia, unspecified: Secondary | ICD-10-CM | POA: Diagnosis not present

## 2021-02-27 DIAGNOSIS — I10 Essential (primary) hypertension: Secondary | ICD-10-CM | POA: Diagnosis not present

## 2021-02-27 DIAGNOSIS — F1120 Opioid dependence, uncomplicated: Secondary | ICD-10-CM | POA: Diagnosis not present

## 2021-02-27 DIAGNOSIS — Z299 Encounter for prophylactic measures, unspecified: Secondary | ICD-10-CM | POA: Diagnosis not present

## 2021-03-27 DIAGNOSIS — J449 Chronic obstructive pulmonary disease, unspecified: Secondary | ICD-10-CM | POA: Diagnosis not present

## 2021-03-27 DIAGNOSIS — Z299 Encounter for prophylactic measures, unspecified: Secondary | ICD-10-CM | POA: Diagnosis not present

## 2021-03-27 DIAGNOSIS — R69 Illness, unspecified: Secondary | ICD-10-CM | POA: Diagnosis not present

## 2021-03-27 DIAGNOSIS — F17210 Nicotine dependence, cigarettes, uncomplicated: Secondary | ICD-10-CM | POA: Diagnosis not present

## 2021-03-27 DIAGNOSIS — I10 Essential (primary) hypertension: Secondary | ICD-10-CM | POA: Diagnosis not present

## 2021-03-27 DIAGNOSIS — F1120 Opioid dependence, uncomplicated: Secondary | ICD-10-CM | POA: Diagnosis not present

## 2021-03-30 DIAGNOSIS — Z79891 Long term (current) use of opiate analgesic: Secondary | ICD-10-CM | POA: Diagnosis not present

## 2021-03-30 DIAGNOSIS — K219 Gastro-esophageal reflux disease without esophagitis: Secondary | ICD-10-CM | POA: Diagnosis not present

## 2021-03-30 DIAGNOSIS — Z7982 Long term (current) use of aspirin: Secondary | ICD-10-CM | POA: Diagnosis not present

## 2021-03-30 DIAGNOSIS — R69 Illness, unspecified: Secondary | ICD-10-CM | POA: Diagnosis not present

## 2021-03-30 DIAGNOSIS — E785 Hyperlipidemia, unspecified: Secondary | ICD-10-CM | POA: Diagnosis not present

## 2021-03-30 DIAGNOSIS — I10 Essential (primary) hypertension: Secondary | ICD-10-CM | POA: Diagnosis not present

## 2021-04-27 DIAGNOSIS — R69 Illness, unspecified: Secondary | ICD-10-CM | POA: Diagnosis not present

## 2021-04-27 DIAGNOSIS — I714 Abdominal aortic aneurysm, without rupture: Secondary | ICD-10-CM | POA: Diagnosis not present

## 2021-04-27 DIAGNOSIS — Z79899 Other long term (current) drug therapy: Secondary | ICD-10-CM | POA: Diagnosis not present

## 2021-04-27 DIAGNOSIS — F17210 Nicotine dependence, cigarettes, uncomplicated: Secondary | ICD-10-CM | POA: Diagnosis not present

## 2021-04-27 DIAGNOSIS — Z299 Encounter for prophylactic measures, unspecified: Secondary | ICD-10-CM | POA: Diagnosis not present

## 2021-04-27 DIAGNOSIS — I10 Essential (primary) hypertension: Secondary | ICD-10-CM | POA: Diagnosis not present

## 2021-04-27 DIAGNOSIS — M549 Dorsalgia, unspecified: Secondary | ICD-10-CM | POA: Diagnosis not present

## 2021-04-27 DIAGNOSIS — F1120 Opioid dependence, uncomplicated: Secondary | ICD-10-CM | POA: Diagnosis not present

## 2021-04-28 DIAGNOSIS — I10 Essential (primary) hypertension: Secondary | ICD-10-CM | POA: Diagnosis not present

## 2021-05-30 DIAGNOSIS — I10 Essential (primary) hypertension: Secondary | ICD-10-CM | POA: Diagnosis not present

## 2021-06-01 DIAGNOSIS — M549 Dorsalgia, unspecified: Secondary | ICD-10-CM | POA: Diagnosis not present

## 2021-06-01 DIAGNOSIS — Z299 Encounter for prophylactic measures, unspecified: Secondary | ICD-10-CM | POA: Diagnosis not present

## 2021-06-01 DIAGNOSIS — I10 Essential (primary) hypertension: Secondary | ICD-10-CM | POA: Diagnosis not present

## 2021-06-01 DIAGNOSIS — R69 Illness, unspecified: Secondary | ICD-10-CM | POA: Diagnosis not present

## 2021-06-29 DIAGNOSIS — I10 Essential (primary) hypertension: Secondary | ICD-10-CM | POA: Diagnosis not present

## 2021-06-30 DIAGNOSIS — Z299 Encounter for prophylactic measures, unspecified: Secondary | ICD-10-CM | POA: Diagnosis not present

## 2021-06-30 DIAGNOSIS — R69 Illness, unspecified: Secondary | ICD-10-CM | POA: Diagnosis not present

## 2021-06-30 DIAGNOSIS — F17210 Nicotine dependence, cigarettes, uncomplicated: Secondary | ICD-10-CM | POA: Diagnosis not present

## 2021-06-30 DIAGNOSIS — M549 Dorsalgia, unspecified: Secondary | ICD-10-CM | POA: Diagnosis not present

## 2021-06-30 DIAGNOSIS — I10 Essential (primary) hypertension: Secondary | ICD-10-CM | POA: Diagnosis not present

## 2021-07-28 DIAGNOSIS — I10 Essential (primary) hypertension: Secondary | ICD-10-CM | POA: Diagnosis not present

## 2021-07-30 DIAGNOSIS — I10 Essential (primary) hypertension: Secondary | ICD-10-CM | POA: Diagnosis not present

## 2021-07-31 DIAGNOSIS — I714 Abdominal aortic aneurysm, without rupture: Secondary | ICD-10-CM | POA: Diagnosis not present

## 2021-07-31 DIAGNOSIS — J449 Chronic obstructive pulmonary disease, unspecified: Secondary | ICD-10-CM | POA: Diagnosis not present

## 2021-07-31 DIAGNOSIS — M549 Dorsalgia, unspecified: Secondary | ICD-10-CM | POA: Diagnosis not present

## 2021-07-31 DIAGNOSIS — F17210 Nicotine dependence, cigarettes, uncomplicated: Secondary | ICD-10-CM | POA: Diagnosis not present

## 2021-07-31 DIAGNOSIS — I10 Essential (primary) hypertension: Secondary | ICD-10-CM | POA: Diagnosis not present

## 2021-07-31 DIAGNOSIS — Z299 Encounter for prophylactic measures, unspecified: Secondary | ICD-10-CM | POA: Diagnosis not present

## 2021-07-31 DIAGNOSIS — R69 Illness, unspecified: Secondary | ICD-10-CM | POA: Diagnosis not present

## 2021-08-30 DIAGNOSIS — I10 Essential (primary) hypertension: Secondary | ICD-10-CM | POA: Diagnosis not present

## 2021-09-07 DIAGNOSIS — Z79899 Other long term (current) drug therapy: Secondary | ICD-10-CM | POA: Diagnosis not present

## 2021-09-07 DIAGNOSIS — Z23 Encounter for immunization: Secondary | ICD-10-CM | POA: Diagnosis not present

## 2021-09-07 DIAGNOSIS — F17210 Nicotine dependence, cigarettes, uncomplicated: Secondary | ICD-10-CM | POA: Diagnosis not present

## 2021-09-07 DIAGNOSIS — Z7189 Other specified counseling: Secondary | ICD-10-CM | POA: Diagnosis not present

## 2021-09-07 DIAGNOSIS — Z125 Encounter for screening for malignant neoplasm of prostate: Secondary | ICD-10-CM | POA: Diagnosis not present

## 2021-09-07 DIAGNOSIS — Z1339 Encounter for screening examination for other mental health and behavioral disorders: Secondary | ICD-10-CM | POA: Diagnosis not present

## 2021-09-07 DIAGNOSIS — R69 Illness, unspecified: Secondary | ICD-10-CM | POA: Diagnosis not present

## 2021-09-07 DIAGNOSIS — Z299 Encounter for prophylactic measures, unspecified: Secondary | ICD-10-CM | POA: Diagnosis not present

## 2021-09-07 DIAGNOSIS — Z0000 Encounter for general adult medical examination without abnormal findings: Secondary | ICD-10-CM | POA: Diagnosis not present

## 2021-09-07 DIAGNOSIS — Z1331 Encounter for screening for depression: Secondary | ICD-10-CM | POA: Diagnosis not present

## 2021-09-07 DIAGNOSIS — R5383 Other fatigue: Secondary | ICD-10-CM | POA: Diagnosis not present

## 2021-09-07 DIAGNOSIS — Z6822 Body mass index (BMI) 22.0-22.9, adult: Secondary | ICD-10-CM | POA: Diagnosis not present

## 2021-09-07 DIAGNOSIS — I10 Essential (primary) hypertension: Secondary | ICD-10-CM | POA: Diagnosis not present

## 2021-09-07 DIAGNOSIS — E7800 Pure hypercholesterolemia, unspecified: Secondary | ICD-10-CM | POA: Diagnosis not present

## 2021-09-29 DIAGNOSIS — I10 Essential (primary) hypertension: Secondary | ICD-10-CM | POA: Diagnosis not present

## 2021-10-19 DIAGNOSIS — M549 Dorsalgia, unspecified: Secondary | ICD-10-CM | POA: Diagnosis not present

## 2021-10-19 DIAGNOSIS — R42 Dizziness and giddiness: Secondary | ICD-10-CM | POA: Diagnosis not present

## 2021-10-19 DIAGNOSIS — I10 Essential (primary) hypertension: Secondary | ICD-10-CM | POA: Diagnosis not present

## 2021-10-19 DIAGNOSIS — F17210 Nicotine dependence, cigarettes, uncomplicated: Secondary | ICD-10-CM | POA: Diagnosis not present

## 2021-10-19 DIAGNOSIS — R69 Illness, unspecified: Secondary | ICD-10-CM | POA: Diagnosis not present

## 2021-10-19 DIAGNOSIS — Z299 Encounter for prophylactic measures, unspecified: Secondary | ICD-10-CM | POA: Diagnosis not present

## 2021-10-30 DIAGNOSIS — I10 Essential (primary) hypertension: Secondary | ICD-10-CM | POA: Diagnosis not present

## 2021-11-20 DIAGNOSIS — I10 Essential (primary) hypertension: Secondary | ICD-10-CM | POA: Diagnosis not present

## 2021-11-20 DIAGNOSIS — R69 Illness, unspecified: Secondary | ICD-10-CM | POA: Diagnosis not present

## 2021-11-20 DIAGNOSIS — M549 Dorsalgia, unspecified: Secondary | ICD-10-CM | POA: Diagnosis not present

## 2021-11-20 DIAGNOSIS — F17210 Nicotine dependence, cigarettes, uncomplicated: Secondary | ICD-10-CM | POA: Diagnosis not present

## 2021-11-20 DIAGNOSIS — F1120 Opioid dependence, uncomplicated: Secondary | ICD-10-CM | POA: Diagnosis not present

## 2021-11-20 DIAGNOSIS — Z299 Encounter for prophylactic measures, unspecified: Secondary | ICD-10-CM | POA: Diagnosis not present

## 2021-11-20 DIAGNOSIS — K219 Gastro-esophageal reflux disease without esophagitis: Secondary | ICD-10-CM | POA: Diagnosis not present

## 2021-11-29 DIAGNOSIS — I10 Essential (primary) hypertension: Secondary | ICD-10-CM | POA: Diagnosis not present

## 2021-12-15 DIAGNOSIS — Z299 Encounter for prophylactic measures, unspecified: Secondary | ICD-10-CM | POA: Diagnosis not present

## 2021-12-15 DIAGNOSIS — R69 Illness, unspecified: Secondary | ICD-10-CM | POA: Diagnosis not present

## 2021-12-15 DIAGNOSIS — F17210 Nicotine dependence, cigarettes, uncomplicated: Secondary | ICD-10-CM | POA: Diagnosis not present

## 2021-12-15 DIAGNOSIS — I10 Essential (primary) hypertension: Secondary | ICD-10-CM | POA: Diagnosis not present

## 2021-12-15 DIAGNOSIS — M549 Dorsalgia, unspecified: Secondary | ICD-10-CM | POA: Diagnosis not present

## 2021-12-29 DIAGNOSIS — I10 Essential (primary) hypertension: Secondary | ICD-10-CM | POA: Diagnosis not present

## 2022-01-11 DIAGNOSIS — Z6822 Body mass index (BMI) 22.0-22.9, adult: Secondary | ICD-10-CM | POA: Diagnosis not present

## 2022-01-11 DIAGNOSIS — Z299 Encounter for prophylactic measures, unspecified: Secondary | ICD-10-CM | POA: Diagnosis not present

## 2022-01-11 DIAGNOSIS — R69 Illness, unspecified: Secondary | ICD-10-CM | POA: Diagnosis not present

## 2022-01-11 DIAGNOSIS — Z79899 Other long term (current) drug therapy: Secondary | ICD-10-CM | POA: Diagnosis not present

## 2022-01-11 DIAGNOSIS — F1120 Opioid dependence, uncomplicated: Secondary | ICD-10-CM | POA: Diagnosis not present

## 2022-01-11 DIAGNOSIS — M549 Dorsalgia, unspecified: Secondary | ICD-10-CM | POA: Diagnosis not present

## 2022-01-11 DIAGNOSIS — F17210 Nicotine dependence, cigarettes, uncomplicated: Secondary | ICD-10-CM | POA: Diagnosis not present

## 2022-01-11 DIAGNOSIS — I10 Essential (primary) hypertension: Secondary | ICD-10-CM | POA: Diagnosis not present

## 2022-01-28 DIAGNOSIS — I10 Essential (primary) hypertension: Secondary | ICD-10-CM | POA: Diagnosis not present

## 2022-02-12 DIAGNOSIS — Z0000 Encounter for general adult medical examination without abnormal findings: Secondary | ICD-10-CM | POA: Diagnosis not present

## 2022-02-12 DIAGNOSIS — Z7189 Other specified counseling: Secondary | ICD-10-CM | POA: Diagnosis not present

## 2022-02-12 DIAGNOSIS — R5383 Other fatigue: Secondary | ICD-10-CM | POA: Diagnosis not present

## 2022-02-12 DIAGNOSIS — F17210 Nicotine dependence, cigarettes, uncomplicated: Secondary | ICD-10-CM | POA: Diagnosis not present

## 2022-02-12 DIAGNOSIS — E7800 Pure hypercholesterolemia, unspecified: Secondary | ICD-10-CM | POA: Diagnosis not present

## 2022-02-12 DIAGNOSIS — Z1331 Encounter for screening for depression: Secondary | ICD-10-CM | POA: Diagnosis not present

## 2022-02-12 DIAGNOSIS — Z6823 Body mass index (BMI) 23.0-23.9, adult: Secondary | ICD-10-CM | POA: Diagnosis not present

## 2022-02-12 DIAGNOSIS — R69 Illness, unspecified: Secondary | ICD-10-CM | POA: Diagnosis not present

## 2022-02-12 DIAGNOSIS — Z1339 Encounter for screening examination for other mental health and behavioral disorders: Secondary | ICD-10-CM | POA: Diagnosis not present

## 2022-02-12 DIAGNOSIS — I10 Essential (primary) hypertension: Secondary | ICD-10-CM | POA: Diagnosis not present

## 2022-02-12 DIAGNOSIS — Z1389 Encounter for screening for other disorder: Secondary | ICD-10-CM | POA: Diagnosis not present

## 2022-02-12 DIAGNOSIS — Z299 Encounter for prophylactic measures, unspecified: Secondary | ICD-10-CM | POA: Diagnosis not present

## 2022-02-12 DIAGNOSIS — Z79899 Other long term (current) drug therapy: Secondary | ICD-10-CM | POA: Diagnosis not present

## 2022-02-13 DIAGNOSIS — I10 Essential (primary) hypertension: Secondary | ICD-10-CM | POA: Diagnosis not present

## 2022-02-13 DIAGNOSIS — M1990 Unspecified osteoarthritis, unspecified site: Secondary | ICD-10-CM | POA: Diagnosis not present

## 2022-02-13 DIAGNOSIS — Z008 Encounter for other general examination: Secondary | ICD-10-CM | POA: Diagnosis not present

## 2022-02-13 DIAGNOSIS — R69 Illness, unspecified: Secondary | ICD-10-CM | POA: Diagnosis not present

## 2022-02-13 DIAGNOSIS — I739 Peripheral vascular disease, unspecified: Secondary | ICD-10-CM | POA: Diagnosis not present

## 2022-02-13 DIAGNOSIS — K5900 Constipation, unspecified: Secondary | ICD-10-CM | POA: Diagnosis not present

## 2022-02-13 DIAGNOSIS — Z79891 Long term (current) use of opiate analgesic: Secondary | ICD-10-CM | POA: Diagnosis not present

## 2022-02-13 DIAGNOSIS — Z7982 Long term (current) use of aspirin: Secondary | ICD-10-CM | POA: Diagnosis not present

## 2022-02-13 DIAGNOSIS — N529 Male erectile dysfunction, unspecified: Secondary | ICD-10-CM | POA: Diagnosis not present

## 2022-02-13 DIAGNOSIS — G8929 Other chronic pain: Secondary | ICD-10-CM | POA: Diagnosis not present

## 2022-02-13 DIAGNOSIS — K219 Gastro-esophageal reflux disease without esophagitis: Secondary | ICD-10-CM | POA: Diagnosis not present

## 2022-02-13 DIAGNOSIS — G629 Polyneuropathy, unspecified: Secondary | ICD-10-CM | POA: Diagnosis not present

## 2022-02-13 DIAGNOSIS — E785 Hyperlipidemia, unspecified: Secondary | ICD-10-CM | POA: Diagnosis not present

## 2022-02-13 DIAGNOSIS — F17210 Nicotine dependence, cigarettes, uncomplicated: Secondary | ICD-10-CM | POA: Diagnosis not present

## 2022-02-27 DIAGNOSIS — I10 Essential (primary) hypertension: Secondary | ICD-10-CM | POA: Diagnosis not present

## 2022-03-12 DIAGNOSIS — Z6824 Body mass index (BMI) 24.0-24.9, adult: Secondary | ICD-10-CM | POA: Diagnosis not present

## 2022-03-12 DIAGNOSIS — M549 Dorsalgia, unspecified: Secondary | ICD-10-CM | POA: Diagnosis not present

## 2022-03-12 DIAGNOSIS — M79606 Pain in leg, unspecified: Secondary | ICD-10-CM | POA: Diagnosis not present

## 2022-03-12 DIAGNOSIS — I10 Essential (primary) hypertension: Secondary | ICD-10-CM | POA: Diagnosis not present

## 2022-03-12 DIAGNOSIS — Z79899 Other long term (current) drug therapy: Secondary | ICD-10-CM | POA: Diagnosis not present

## 2022-03-12 DIAGNOSIS — F17210 Nicotine dependence, cigarettes, uncomplicated: Secondary | ICD-10-CM | POA: Diagnosis not present

## 2022-03-12 DIAGNOSIS — Z299 Encounter for prophylactic measures, unspecified: Secondary | ICD-10-CM | POA: Diagnosis not present

## 2022-03-12 DIAGNOSIS — R69 Illness, unspecified: Secondary | ICD-10-CM | POA: Diagnosis not present

## 2022-03-19 DIAGNOSIS — I70211 Atherosclerosis of native arteries of extremities with intermittent claudication, right leg: Secondary | ICD-10-CM | POA: Diagnosis not present

## 2022-03-19 DIAGNOSIS — I739 Peripheral vascular disease, unspecified: Secondary | ICD-10-CM | POA: Diagnosis not present

## 2022-03-22 DIAGNOSIS — I739 Peripheral vascular disease, unspecified: Secondary | ICD-10-CM | POA: Diagnosis not present

## 2022-03-22 DIAGNOSIS — R69 Illness, unspecified: Secondary | ICD-10-CM | POA: Diagnosis not present

## 2022-03-22 DIAGNOSIS — I10 Essential (primary) hypertension: Secondary | ICD-10-CM | POA: Diagnosis not present

## 2022-03-22 DIAGNOSIS — F17210 Nicotine dependence, cigarettes, uncomplicated: Secondary | ICD-10-CM | POA: Diagnosis not present

## 2022-03-22 DIAGNOSIS — Z299 Encounter for prophylactic measures, unspecified: Secondary | ICD-10-CM | POA: Diagnosis not present

## 2022-03-29 DIAGNOSIS — I10 Essential (primary) hypertension: Secondary | ICD-10-CM | POA: Diagnosis not present

## 2022-04-12 DIAGNOSIS — Z299 Encounter for prophylactic measures, unspecified: Secondary | ICD-10-CM | POA: Diagnosis not present

## 2022-04-12 DIAGNOSIS — I10 Essential (primary) hypertension: Secondary | ICD-10-CM | POA: Diagnosis not present

## 2022-04-12 DIAGNOSIS — E7800 Pure hypercholesterolemia, unspecified: Secondary | ICD-10-CM | POA: Diagnosis not present

## 2022-04-12 DIAGNOSIS — M549 Dorsalgia, unspecified: Secondary | ICD-10-CM | POA: Diagnosis not present

## 2022-04-12 DIAGNOSIS — R69 Illness, unspecified: Secondary | ICD-10-CM | POA: Diagnosis not present

## 2022-04-12 DIAGNOSIS — F17210 Nicotine dependence, cigarettes, uncomplicated: Secondary | ICD-10-CM | POA: Diagnosis not present

## 2022-04-29 DIAGNOSIS — I10 Essential (primary) hypertension: Secondary | ICD-10-CM | POA: Diagnosis not present

## 2022-05-10 DIAGNOSIS — I10 Essential (primary) hypertension: Secondary | ICD-10-CM | POA: Diagnosis not present

## 2022-05-10 DIAGNOSIS — Z299 Encounter for prophylactic measures, unspecified: Secondary | ICD-10-CM | POA: Diagnosis not present

## 2022-05-10 DIAGNOSIS — R69 Illness, unspecified: Secondary | ICD-10-CM | POA: Diagnosis not present

## 2022-05-10 DIAGNOSIS — F17210 Nicotine dependence, cigarettes, uncomplicated: Secondary | ICD-10-CM | POA: Diagnosis not present

## 2022-05-29 DIAGNOSIS — I10 Essential (primary) hypertension: Secondary | ICD-10-CM | POA: Diagnosis not present

## 2022-06-07 DIAGNOSIS — Z299 Encounter for prophylactic measures, unspecified: Secondary | ICD-10-CM | POA: Diagnosis not present

## 2022-06-07 DIAGNOSIS — F17210 Nicotine dependence, cigarettes, uncomplicated: Secondary | ICD-10-CM | POA: Diagnosis not present

## 2022-06-07 DIAGNOSIS — R69 Illness, unspecified: Secondary | ICD-10-CM | POA: Diagnosis not present

## 2022-06-07 DIAGNOSIS — M549 Dorsalgia, unspecified: Secondary | ICD-10-CM | POA: Diagnosis not present

## 2022-06-07 DIAGNOSIS — I10 Essential (primary) hypertension: Secondary | ICD-10-CM | POA: Diagnosis not present

## 2022-06-28 DIAGNOSIS — I10 Essential (primary) hypertension: Secondary | ICD-10-CM | POA: Diagnosis not present

## 2022-07-04 DIAGNOSIS — M549 Dorsalgia, unspecified: Secondary | ICD-10-CM | POA: Diagnosis not present

## 2022-07-04 DIAGNOSIS — F17210 Nicotine dependence, cigarettes, uncomplicated: Secondary | ICD-10-CM | POA: Diagnosis not present

## 2022-07-04 DIAGNOSIS — Z299 Encounter for prophylactic measures, unspecified: Secondary | ICD-10-CM | POA: Diagnosis not present

## 2022-07-04 DIAGNOSIS — I10 Essential (primary) hypertension: Secondary | ICD-10-CM | POA: Diagnosis not present

## 2022-07-04 DIAGNOSIS — R69 Illness, unspecified: Secondary | ICD-10-CM | POA: Diagnosis not present

## 2022-07-18 DIAGNOSIS — I739 Peripheral vascular disease, unspecified: Secondary | ICD-10-CM | POA: Diagnosis not present

## 2022-07-18 DIAGNOSIS — Z299 Encounter for prophylactic measures, unspecified: Secondary | ICD-10-CM | POA: Diagnosis not present

## 2022-07-18 DIAGNOSIS — R69 Illness, unspecified: Secondary | ICD-10-CM | POA: Diagnosis not present

## 2022-07-18 DIAGNOSIS — I10 Essential (primary) hypertension: Secondary | ICD-10-CM | POA: Diagnosis not present

## 2022-07-18 DIAGNOSIS — F17210 Nicotine dependence, cigarettes, uncomplicated: Secondary | ICD-10-CM | POA: Diagnosis not present

## 2022-08-01 DIAGNOSIS — L729 Follicular cyst of the skin and subcutaneous tissue, unspecified: Secondary | ICD-10-CM | POA: Diagnosis not present

## 2022-08-01 DIAGNOSIS — R69 Illness, unspecified: Secondary | ICD-10-CM | POA: Diagnosis not present

## 2022-08-01 DIAGNOSIS — I10 Essential (primary) hypertension: Secondary | ICD-10-CM | POA: Diagnosis not present

## 2022-08-01 DIAGNOSIS — Z299 Encounter for prophylactic measures, unspecified: Secondary | ICD-10-CM | POA: Diagnosis not present

## 2022-08-01 DIAGNOSIS — F17210 Nicotine dependence, cigarettes, uncomplicated: Secondary | ICD-10-CM | POA: Diagnosis not present

## 2022-08-10 DIAGNOSIS — I10 Essential (primary) hypertension: Secondary | ICD-10-CM | POA: Diagnosis not present

## 2022-08-10 DIAGNOSIS — M549 Dorsalgia, unspecified: Secondary | ICD-10-CM | POA: Diagnosis not present

## 2022-08-10 DIAGNOSIS — Z299 Encounter for prophylactic measures, unspecified: Secondary | ICD-10-CM | POA: Diagnosis not present

## 2022-08-10 DIAGNOSIS — F17210 Nicotine dependence, cigarettes, uncomplicated: Secondary | ICD-10-CM | POA: Diagnosis not present

## 2022-08-10 DIAGNOSIS — R69 Illness, unspecified: Secondary | ICD-10-CM | POA: Diagnosis not present

## 2022-09-10 DIAGNOSIS — Z713 Dietary counseling and surveillance: Secondary | ICD-10-CM | POA: Diagnosis not present

## 2022-09-10 DIAGNOSIS — Z0000 Encounter for general adult medical examination without abnormal findings: Secondary | ICD-10-CM | POA: Diagnosis not present

## 2022-09-10 DIAGNOSIS — Z125 Encounter for screening for malignant neoplasm of prostate: Secondary | ICD-10-CM | POA: Diagnosis not present

## 2022-09-10 DIAGNOSIS — Z23 Encounter for immunization: Secondary | ICD-10-CM | POA: Diagnosis not present

## 2022-09-10 DIAGNOSIS — Z6824 Body mass index (BMI) 24.0-24.9, adult: Secondary | ICD-10-CM | POA: Diagnosis not present

## 2022-09-10 DIAGNOSIS — Z299 Encounter for prophylactic measures, unspecified: Secondary | ICD-10-CM | POA: Diagnosis not present

## 2022-09-10 DIAGNOSIS — E7800 Pure hypercholesterolemia, unspecified: Secondary | ICD-10-CM | POA: Diagnosis not present

## 2022-09-10 DIAGNOSIS — R5383 Other fatigue: Secondary | ICD-10-CM | POA: Diagnosis not present

## 2022-09-10 DIAGNOSIS — Z79899 Other long term (current) drug therapy: Secondary | ICD-10-CM | POA: Diagnosis not present

## 2022-09-10 DIAGNOSIS — I10 Essential (primary) hypertension: Secondary | ICD-10-CM | POA: Diagnosis not present

## 2022-11-12 DIAGNOSIS — Z79899 Other long term (current) drug therapy: Secondary | ICD-10-CM | POA: Diagnosis not present

## 2022-11-12 DIAGNOSIS — Z299 Encounter for prophylactic measures, unspecified: Secondary | ICD-10-CM | POA: Diagnosis not present

## 2022-11-12 DIAGNOSIS — M549 Dorsalgia, unspecified: Secondary | ICD-10-CM | POA: Diagnosis not present

## 2022-11-12 DIAGNOSIS — F17210 Nicotine dependence, cigarettes, uncomplicated: Secondary | ICD-10-CM | POA: Diagnosis not present

## 2022-11-12 DIAGNOSIS — R69 Illness, unspecified: Secondary | ICD-10-CM | POA: Diagnosis not present

## 2022-11-12 DIAGNOSIS — I10 Essential (primary) hypertension: Secondary | ICD-10-CM | POA: Diagnosis not present

## 2022-11-12 DIAGNOSIS — I739 Peripheral vascular disease, unspecified: Secondary | ICD-10-CM | POA: Diagnosis not present

## 2023-01-08 DIAGNOSIS — R69 Illness, unspecified: Secondary | ICD-10-CM | POA: Diagnosis not present

## 2023-01-08 DIAGNOSIS — Z79899 Other long term (current) drug therapy: Secondary | ICD-10-CM | POA: Diagnosis not present

## 2023-01-08 DIAGNOSIS — F17210 Nicotine dependence, cigarettes, uncomplicated: Secondary | ICD-10-CM | POA: Diagnosis not present

## 2023-01-08 DIAGNOSIS — J449 Chronic obstructive pulmonary disease, unspecified: Secondary | ICD-10-CM | POA: Diagnosis not present

## 2023-01-08 DIAGNOSIS — I10 Essential (primary) hypertension: Secondary | ICD-10-CM | POA: Diagnosis not present

## 2023-01-08 DIAGNOSIS — Z6823 Body mass index (BMI) 23.0-23.9, adult: Secondary | ICD-10-CM | POA: Diagnosis not present

## 2023-01-08 DIAGNOSIS — M549 Dorsalgia, unspecified: Secondary | ICD-10-CM | POA: Diagnosis not present

## 2023-01-08 DIAGNOSIS — Z299 Encounter for prophylactic measures, unspecified: Secondary | ICD-10-CM | POA: Diagnosis not present

## 2023-02-12 DIAGNOSIS — I10 Essential (primary) hypertension: Secondary | ICD-10-CM | POA: Diagnosis not present

## 2023-02-12 DIAGNOSIS — Z79899 Other long term (current) drug therapy: Secondary | ICD-10-CM | POA: Diagnosis not present

## 2023-02-12 DIAGNOSIS — R5383 Other fatigue: Secondary | ICD-10-CM | POA: Diagnosis not present

## 2023-02-12 DIAGNOSIS — Z1339 Encounter for screening examination for other mental health and behavioral disorders: Secondary | ICD-10-CM | POA: Diagnosis not present

## 2023-02-12 DIAGNOSIS — Z6823 Body mass index (BMI) 23.0-23.9, adult: Secondary | ICD-10-CM | POA: Diagnosis not present

## 2023-02-12 DIAGNOSIS — E7800 Pure hypercholesterolemia, unspecified: Secondary | ICD-10-CM | POA: Diagnosis not present

## 2023-02-12 DIAGNOSIS — Z1331 Encounter for screening for depression: Secondary | ICD-10-CM | POA: Diagnosis not present

## 2023-02-12 DIAGNOSIS — Z7189 Other specified counseling: Secondary | ICD-10-CM | POA: Diagnosis not present

## 2023-02-12 DIAGNOSIS — Z0000 Encounter for general adult medical examination without abnormal findings: Secondary | ICD-10-CM | POA: Diagnosis not present

## 2023-02-12 DIAGNOSIS — Z299 Encounter for prophylactic measures, unspecified: Secondary | ICD-10-CM | POA: Diagnosis not present

## 2023-04-15 DIAGNOSIS — I10 Essential (primary) hypertension: Secondary | ICD-10-CM | POA: Diagnosis not present

## 2023-04-15 DIAGNOSIS — M549 Dorsalgia, unspecified: Secondary | ICD-10-CM | POA: Diagnosis not present

## 2023-04-15 DIAGNOSIS — M65342 Trigger finger, left ring finger: Secondary | ICD-10-CM | POA: Diagnosis not present

## 2023-04-15 DIAGNOSIS — F17210 Nicotine dependence, cigarettes, uncomplicated: Secondary | ICD-10-CM | POA: Diagnosis not present

## 2023-04-15 DIAGNOSIS — Z299 Encounter for prophylactic measures, unspecified: Secondary | ICD-10-CM | POA: Diagnosis not present

## 2023-04-15 DIAGNOSIS — R69 Illness, unspecified: Secondary | ICD-10-CM | POA: Diagnosis not present

## 2023-05-29 DIAGNOSIS — M1990 Unspecified osteoarthritis, unspecified site: Secondary | ICD-10-CM | POA: Diagnosis not present

## 2023-05-29 DIAGNOSIS — N1831 Chronic kidney disease, stage 3a: Secondary | ICD-10-CM | POA: Diagnosis not present

## 2023-05-29 DIAGNOSIS — N529 Male erectile dysfunction, unspecified: Secondary | ICD-10-CM | POA: Diagnosis not present

## 2023-05-29 DIAGNOSIS — Z811 Family history of alcohol abuse and dependence: Secondary | ICD-10-CM | POA: Diagnosis not present

## 2023-05-29 DIAGNOSIS — Z809 Family history of malignant neoplasm, unspecified: Secondary | ICD-10-CM | POA: Diagnosis not present

## 2023-05-29 DIAGNOSIS — I129 Hypertensive chronic kidney disease with stage 1 through stage 4 chronic kidney disease, or unspecified chronic kidney disease: Secondary | ICD-10-CM | POA: Diagnosis not present

## 2023-05-29 DIAGNOSIS — Z79891 Long term (current) use of opiate analgesic: Secondary | ICD-10-CM | POA: Diagnosis not present

## 2023-05-29 DIAGNOSIS — I70209 Unspecified atherosclerosis of native arteries of extremities, unspecified extremity: Secondary | ICD-10-CM | POA: Diagnosis not present

## 2023-05-29 DIAGNOSIS — E785 Hyperlipidemia, unspecified: Secondary | ICD-10-CM | POA: Diagnosis not present

## 2023-05-29 DIAGNOSIS — F17210 Nicotine dependence, cigarettes, uncomplicated: Secondary | ICD-10-CM | POA: Diagnosis not present

## 2023-05-29 DIAGNOSIS — G629 Polyneuropathy, unspecified: Secondary | ICD-10-CM | POA: Diagnosis not present

## 2023-05-29 DIAGNOSIS — Z008 Encounter for other general examination: Secondary | ICD-10-CM | POA: Diagnosis not present

## 2023-05-29 DIAGNOSIS — K219 Gastro-esophageal reflux disease without esophagitis: Secondary | ICD-10-CM | POA: Diagnosis not present

## 2023-06-07 DIAGNOSIS — N183 Chronic kidney disease, stage 3 unspecified: Secondary | ICD-10-CM | POA: Diagnosis not present

## 2023-06-07 DIAGNOSIS — I739 Peripheral vascular disease, unspecified: Secondary | ICD-10-CM | POA: Diagnosis not present

## 2023-06-07 DIAGNOSIS — I10 Essential (primary) hypertension: Secondary | ICD-10-CM | POA: Diagnosis not present

## 2023-06-07 DIAGNOSIS — I7141 Pararenal abdominal aortic aneurysm, without rupture: Secondary | ICD-10-CM | POA: Diagnosis not present

## 2023-06-07 DIAGNOSIS — Z0000 Encounter for general adult medical examination without abnormal findings: Secondary | ICD-10-CM | POA: Diagnosis not present

## 2023-06-07 DIAGNOSIS — Z299 Encounter for prophylactic measures, unspecified: Secondary | ICD-10-CM | POA: Diagnosis not present

## 2023-06-07 DIAGNOSIS — M10071 Idiopathic gout, right ankle and foot: Secondary | ICD-10-CM | POA: Diagnosis not present

## 2023-08-07 DIAGNOSIS — I10 Essential (primary) hypertension: Secondary | ICD-10-CM | POA: Diagnosis not present

## 2023-08-07 DIAGNOSIS — Z299 Encounter for prophylactic measures, unspecified: Secondary | ICD-10-CM | POA: Diagnosis not present

## 2023-08-07 DIAGNOSIS — M10072 Idiopathic gout, left ankle and foot: Secondary | ICD-10-CM | POA: Diagnosis not present

## 2023-08-07 DIAGNOSIS — F1120 Opioid dependence, uncomplicated: Secondary | ICD-10-CM | POA: Diagnosis not present

## 2023-08-07 DIAGNOSIS — M549 Dorsalgia, unspecified: Secondary | ICD-10-CM | POA: Diagnosis not present

## 2023-09-06 DIAGNOSIS — I10 Essential (primary) hypertension: Secondary | ICD-10-CM | POA: Diagnosis not present

## 2023-09-06 DIAGNOSIS — Z299 Encounter for prophylactic measures, unspecified: Secondary | ICD-10-CM | POA: Diagnosis not present

## 2023-09-06 DIAGNOSIS — Z79899 Other long term (current) drug therapy: Secondary | ICD-10-CM | POA: Diagnosis not present

## 2023-09-06 DIAGNOSIS — R531 Weakness: Secondary | ICD-10-CM | POA: Diagnosis not present

## 2023-09-06 DIAGNOSIS — M549 Dorsalgia, unspecified: Secondary | ICD-10-CM | POA: Diagnosis not present

## 2023-09-06 DIAGNOSIS — F17210 Nicotine dependence, cigarettes, uncomplicated: Secondary | ICD-10-CM | POA: Diagnosis not present

## 2024-02-17 DIAGNOSIS — R5383 Other fatigue: Secondary | ICD-10-CM | POA: Diagnosis not present

## 2024-02-17 DIAGNOSIS — E7800 Pure hypercholesterolemia, unspecified: Secondary | ICD-10-CM | POA: Diagnosis not present

## 2024-02-17 DIAGNOSIS — Z79899 Other long term (current) drug therapy: Secondary | ICD-10-CM | POA: Diagnosis not present

## 2024-02-17 DIAGNOSIS — Z125 Encounter for screening for malignant neoplasm of prostate: Secondary | ICD-10-CM | POA: Diagnosis not present
